# Patient Record
Sex: Female | Born: 1990 | Race: Black or African American | Hispanic: No | Marital: Single | Smoking: Former smoker
Health system: Southern US, Community
[De-identification: ages and names within clinical notes are randomized; demographics above are authoritative.]

## PROBLEM LIST (undated history)

## (undated) DIAGNOSIS — S61209A Unspecified open wound of unspecified finger without damage to nail, initial encounter: Secondary | ICD-10-CM | POA: Insufficient documentation

## (undated) DIAGNOSIS — Z5189 Encounter for other specified aftercare: Secondary | ICD-10-CM

## (undated) DIAGNOSIS — O09899 Supervision of other high risk pregnancies, unspecified trimester: Secondary | ICD-10-CM

## (undated) DIAGNOSIS — Z87898 Personal history of other specified conditions: Secondary | ICD-10-CM

## (undated) DIAGNOSIS — R569 Unspecified convulsions: Secondary | ICD-10-CM

## (undated) DIAGNOSIS — D649 Anemia, unspecified: Secondary | ICD-10-CM

## (undated) DIAGNOSIS — O88219 Thromboembolism in pregnancy, unspecified trimester: Secondary | ICD-10-CM

## (undated) DIAGNOSIS — Z8759 Personal history of other complications of pregnancy, childbirth and the puerperium: Secondary | ICD-10-CM

## (undated) HISTORY — DX: Personal history of other specified conditions: Z87.898

## (undated) HISTORY — DX: Thromboembolism in pregnancy, unspecified trimester: O88.219

## (undated) HISTORY — PX: WISDOM TOOTH EXTRACTION: SHX21

## (undated) HISTORY — DX: Unspecified convulsions: R56.9

## (undated) HISTORY — DX: Supervision of other high risk pregnancies, unspecified trimester: O09.899

## (undated) HISTORY — DX: Encounter for other specified aftercare: Z51.89

---

## 2015-01-08 DIAGNOSIS — B009 Herpesviral infection, unspecified: Secondary | ICD-10-CM | POA: Insufficient documentation

## 2015-01-08 DIAGNOSIS — A749 Chlamydial infection, unspecified: Secondary | ICD-10-CM | POA: Insufficient documentation

## 2016-04-17 ENCOUNTER — Emergency Department
Admission: EM | Admit: 2016-04-17 | Discharge: 2016-04-17 | Disposition: A | Discharge status: Home / Self Care | Payer: Medicaid Other | Attending: Emergency Medicine | Admitting: Emergency Medicine

## 2016-04-17 ENCOUNTER — Encounter: Payer: Self-pay | Admitting: Emergency Medicine

## 2016-04-17 DIAGNOSIS — R109 Unspecified abdominal pain: Secondary | ICD-10-CM | POA: Diagnosis present

## 2016-04-17 DIAGNOSIS — K529 Noninfective gastroenteritis and colitis, unspecified: Secondary | ICD-10-CM | POA: Insufficient documentation

## 2016-04-17 LAB — COMPREHENSIVE METABOLIC PANEL
ALBUMIN: 4.2 g/dL (ref 3.5–5.0)
ALK PHOS: 93 U/L (ref 38–126)
ALT: 21 U/L (ref 14–54)
ANION GAP: 6 (ref 5–15)
AST: 17 U/L (ref 15–41)
BUN: 14 mg/dL (ref 6–20)
CALCIUM: 8.9 mg/dL (ref 8.9–10.3)
CHLORIDE: 111 mmol/L (ref 101–111)
CO2: 23 mmol/L (ref 22–32)
Creatinine, Ser: 0.84 mg/dL (ref 0.44–1.00)
GFR calc non Af Amer: >60 mL/min (ref >60)
GLUCOSE: 90 mg/dL (ref 65–99)
POTASSIUM: 3.7 mmol/L (ref 3.5–5.1)
SODIUM: 140 mmol/L (ref 135–145)
Total Bilirubin: 0.4 mg/dL (ref 0.3–1.2)
Total Protein: 7.8 g/dL (ref 6.5–8.1)

## 2016-04-17 LAB — URINALYSIS, COMPLETE (UACMP) WITH MICROSCOPIC
Bilirubin Urine: NEGATIVE
Glucose, UA: NEGATIVE mg/dL
Hgb urine dipstick: NEGATIVE
KETONES UR: NEGATIVE mg/dL
Nitrite: NEGATIVE
PROTEIN: 30 mg/dL — AB
Specific Gravity, Urine: 1.029 (ref 1.005–1.030)
pH: 5 (ref 5.0–8.0)

## 2016-04-17 LAB — POCT PREGNANCY, URINE: PREG TEST UR: NEGATIVE

## 2016-04-17 LAB — LIPASE, BLOOD: LIPASE: 13 U/L (ref 11–51)

## 2016-04-17 LAB — CBC
HEMATOCRIT: 40.3 % (ref 35.0–47.0)
HEMOGLOBIN: 13.2 g/dL (ref 12.0–16.0)
MCH: 25.3 pg — AB (ref 26.0–34.0)
MCHC: 32.7 g/dL (ref 32.0–36.0)
MCV: 77.2 fL — AB (ref 80.0–100.0)
Platelets: 233 10*3/uL (ref 150–440)
RBC: 5.21 MIL/uL — AB (ref 3.80–5.20)
RDW: 15.7 % — ABNORMAL HIGH (ref 11.5–14.5)
WBC: 5.4 10*3/uL (ref 3.6–11.0)

## 2016-04-17 MED ORDER — FAMOTIDINE 20 MG PO TABS: 20.0000 mg | ORAL_TABLET | Freq: Two times a day (BID) | ORAL | 0 refills | Status: DC

## 2016-04-17 MED ORDER — ONDANSETRON HCL 4 MG PO TABS
ORAL_TABLET | ORAL | Status: AC
  Filled 2016-04-17: qty 1

## 2016-04-17 MED ORDER — ONDANSETRON HCL 4 MG PO TABS: 4.0000 mg | ORAL_TABLET | Freq: Every day | ORAL | 0 refills | Status: DC | PRN

## 2016-04-17 MED ORDER — ONDANSETRON HCL 4 MG PO TABS
4.0000 mg | ORAL_TABLET | Freq: Once | ORAL | Status: AC
  Administered 2016-04-17: 4 mg via ORAL
  Filled 2016-04-17: qty 1

## 2016-04-17 MED ORDER — FAMOTIDINE 20 MG PO TABS
20.0000 mg | ORAL_TABLET | Freq: Once | ORAL | Status: AC
  Administered 2016-04-17: 20 mg via ORAL
  Filled 2016-04-17: qty 1

## 2016-04-17 NOTE — ED Triage Notes (Signed)
Pt to ed with c/o abd pain, diarrhea, vomiting since this am.

## 2016-04-17 NOTE — ED Provider Notes (Signed)
[facility] Emergency Department Provider Note  ____________________________________________  Time seen: Approximately 2:21 PM  I have reviewed the triage vital signs and the nursing notes.   HISTORY  Chief Complaint Abdominal Pain    HPI Gerre Pebblesisha Key is a 25 y.o. female of the emergency department with 1 day of abdominal pain, nausea, vomiting and diarrhea. Patient has eaten crackers today and has not been able to keep them down. Patient has had some chills but no fever. Patient has not taken anything for symptoms. Patient states that family members in the house had similar symptoms yesterday. No history of GERD. Patient denies recent illness, shortness of breath, chest pain or urinary symptoms.   History reviewed. No pertinent past medical history.  There are no active problems to display for this patient.   History reviewed. No pertinent surgical history.  Prior to Admission medications   Medication Sig Start Date End Date Taking? Authorizing Provider  famotidine (PEPCID) 20 MG tablet Take 1 tablet (20 mg total) by mouth 2 (two) times daily. 04/17/16 04/17/17  Enid DerryAshley Damyiah Moxley, PA-C  ondansetron (ZOFRAN) 4 MG tablet Take 1 tablet (4 mg total) by mouth daily as needed for nausea or vomiting. 04/17/16 04/17/17  Enid DerryAshley Jazmeen Axtell, PA-C    Allergies Patient has no known allergies.  History reviewed. No pertinent family history.  Social History Social History  Substance Use Topics  . Smoking status: Never Smoker  . Smokeless tobacco: Never Used  . Alcohol use No     Review of Systems  Constitutional: No fever ENT: No upper respiratory complaints. Cardiovascular: No chest pain. Respiratory: No cough. No SOB. Gastrointestinal: Positive for abdominal pain, nausea, vomiting.  Genitourinary: Negative for dysuria. Musculoskeletal: Negative for musculoskeletal pain. Neurological: Negative for  headaches.   ____________________________________________   PHYSICAL EXAM:  VITAL SIGNS: ED Triage Vitals [04/17/16 1309]  Enc Vitals Group     BP      Pulse Rate 94     Resp 20     Temp 97.5 F (36.4 C)     Temp Source Oral     SpO2 97 %     Weight 288 lb (130.6 kg)     Height 5\' 8"  (1.727 m)     Head Circumference      Peak Flow      Pain Score 8     Pain Loc      Pain Edu?      Excl. in GC?      Constitutional: Alert and oriented. Well appearing and in no acute distress. Eyes: Conjunctivae are normal. PERRL. EOMI. Head: Atraumatic. ENT:      Ears:      Nose: No congestion/rhinnorhea.      Mouth/Throat: Mucous membranes are moist.  Neck: No stridor.  Cardiovascular: Normal rate, regular rhythm. Normal S1 and S2.  Good peripheral circulation. Respiratory: Normal respiratory effort without tachypnea or retractions. Lungs CTAB. Good air entry to the bases with no decreased or absent breath sounds. Gastrointestinal: Bowel sounds 4 quadrants. Epigastric tenderness to palpation. No guarding or rigidity. No palpable masses. No distention. No CVA tenderness. Musculoskeletal: Full range of motion to all extremities. No gross deformities appreciated. Neurologic:  Normal speech and language. No gross focal neurologic deficits are appreciated.  Skin:  Skin is warm, dry and intact. No rash noted. Psychiatric: Mood and affect are normal. Speech and behavior are normal. Patient exhibits appropriate insight and judgement.   ____________________________________________   LABS (all labs ordered are listed, but  only abnormal results are displayed)  Labs Reviewed  CBC - Abnormal; Notable for the following:       Result Value   RBC 5.21 (*)    MCV 77.2 (*)    MCH 25.3 (*)    RDW 15.7 (*)    All other components within normal limits  URINALYSIS, COMPLETE (UACMP) WITH MICROSCOPIC - Abnormal; Notable for the following:    Color, Urine YELLOW (*)    APPearance CLOUDY (*)     Protein, ur 30 (*)    Leukocytes, UA TRACE (*)    Bacteria, UA RARE (*)    Squamous Epithelial / LPF TOO NUMEROUS TO COUNT (*)    All other components within normal limits  LIPASE, BLOOD  COMPREHENSIVE METABOLIC PANEL  POC URINE PREG, ED  POCT PREGNANCY, URINE   ____________________________________________  EKG   ____________________________________________  RADIOLOGY  No results found.  ____________________________________________    PROCEDURES  Procedure(s) performed:    Procedures    Medications  ondansetron (ZOFRAN) tablet 4 mg (4 mg Oral Given 04/17/16 1427)  famotidine (PEPCID) tablet 20 mg (20 mg Oral Given 04/17/16 1444)     ____________________________________________   INITIAL IMPRESSION / ASSESSMENT AND PLAN / ED COURSE  Pertinent labs & imaging results that were available during my care of the patient were reviewed by me and considered in my medical decision making (see chart for details).  Review of the [place] CSRS was performed in accordance of the [facility] prior to dispensing any controlled drugs.  Clinical Course     ----------------------------------------- 2:49 PM on 04/17/2016 -----------------------------------------  Patient states that she is feeling better. Zofran has helped. Patient is drinking water and eating crackers.  ----------------------------------------- 3:18 PM on 04/17/2016 -----------------------------------------  Patient states the pain and nausea are better since Zofran and Pepcid. Patient was able to drink water and eat crackers without vomiting. Patient thinks that she can continue to drink fluids without vomiting and does not need IV.    Patient's diagnosis is consistent with gastroenteritis. Family members in the house have similar symptoms. Zofran and Pepcid improved symptoms. Patient was able to keep water and crackers down without vomiting in emergency room. Exam and vital signs are reassuring. Patient's symptoms  improved before leaving Ed, and patient is comfortable and ready to go home. Patient will be discharged home with prescriptions for zofran and pepcid.  Patient is to follow up with PCP as directed. Patient is given ED precautions to return to the ED for any worsening or new symptoms. All questions were answered.  ____________________________________________  FINAL CLINICAL IMPRESSION(S) / ED DIAGNOSES  Final diagnoses:  Gastroenteritis      NEW MEDICATIONS STARTED DURING THIS VISIT:  New Prescriptions   FAMOTIDINE (PEPCID) 20 MG TABLET    Take 1 tablet (20 mg total) by mouth 2 (two) times daily.   ONDANSETRON (ZOFRAN) 4 MG TABLET    Take 1 tablet (4 mg total) by mouth daily as needed for nausea or vomiting.        This chart was dictated using voice recognition software/Dragon. Despite best efforts to proofread, errors can occur which can change the meaning. Any change was purely unintentional.    Enid DerryAshley Tiwanda Threats, PA-C 04/17/16 1604    Emily FilbertJonathan E Williams, MD 04/17/16 [phone]

## 2016-07-28 ENCOUNTER — Emergency Department: Payer: Medicaid Other

## 2016-07-28 ENCOUNTER — Emergency Department
Admission: EM | Admit: 2016-07-28 | Discharge: 2016-07-28 | Disposition: A | Discharge status: Home / Self Care | Payer: Medicaid Other | Attending: Emergency Medicine | Admitting: Emergency Medicine

## 2016-07-28 ENCOUNTER — Encounter: Payer: Self-pay | Admitting: Emergency Medicine

## 2016-07-28 DIAGNOSIS — X501XXA Overexertion from prolonged static or awkward postures, initial encounter: Secondary | ICD-10-CM | POA: Insufficient documentation

## 2016-07-28 DIAGNOSIS — S3992XA Unspecified injury of lower back, initial encounter: Secondary | ICD-10-CM | POA: Diagnosis present

## 2016-07-28 DIAGNOSIS — Y929 Unspecified place or not applicable: Secondary | ICD-10-CM | POA: Insufficient documentation

## 2016-07-28 DIAGNOSIS — Z79899 Other long term (current) drug therapy: Secondary | ICD-10-CM | POA: Insufficient documentation

## 2016-07-28 DIAGNOSIS — Y9389 Activity, other specified: Secondary | ICD-10-CM | POA: Diagnosis not present

## 2016-07-28 DIAGNOSIS — Y999 Unspecified external cause status: Secondary | ICD-10-CM | POA: Diagnosis not present

## 2016-07-28 DIAGNOSIS — S39012A Strain of muscle, fascia and tendon of lower back, initial encounter: Secondary | ICD-10-CM | POA: Insufficient documentation

## 2016-07-28 LAB — POCT PREGNANCY, URINE: PREG TEST UR: NEGATIVE

## 2016-07-28 MED ORDER — CYCLOBENZAPRINE HCL 5 MG PO TABS: 5.0000 mg | ORAL_TABLET | Freq: Three times a day (TID) | ORAL | 0 refills | Status: AC | PRN

## 2016-07-28 MED ORDER — IBUPROFEN 800 MG PO TABS: 800.0000 mg | ORAL_TABLET | Freq: Three times a day (TID) | ORAL | 0 refills | Status: DC | PRN

## 2016-07-28 NOTE — ED Provider Notes (Signed)
[facility] Emergency Department Provider Note  ____________________________________________  Time seen: Approximately 6:18 PM  I have reviewed the triage vital signs and the nursing notes.   HISTORY  Chief Complaint Back Pain    HPI Sophia Olson is a 26 y.o. female that presents to the emergency department with low back pain after picking up a childs 4 wheeler. Pain is sharp and does not radiate. She states that the 4 wheeler is quite large and heavy. She states that while she picked it up she heard a crack in her back. She is concerned that she fractured something pulled a muscle. She denies any additional injuries. No fever, shortness of breath, chest pain, nausea, vomiting, abdominal pain, bowel or bladder dysfunction, felt paresthesias, dysuria, urgency, frequency, numbness, tingling.   History reviewed. No pertinent past medical history.  There are no active problems to display for this patient.   History reviewed. No pertinent surgical history.  Prior to Admission medications   Medication Sig Start Date End Date Taking? Authorizing Provider  cyclobenzaprine (FLEXERIL) 5 MG tablet Take 1 tablet (5 mg total) by mouth 3 (three) times daily as needed for muscle spasms. 07/28/16 08/04/16  Enid Derry, PA-C  famotidine (PEPCID) 20 MG tablet Take 1 tablet (20 mg total) by mouth 2 (two) times daily. 04/17/16 04/17/17  Enid Derry, PA-C  ibuprofen (ADVIL,MOTRIN) 800 MG tablet Take 1 tablet (800 mg total) by mouth every 8 (eight) hours as needed. 07/28/16   Enid Derry, PA-C  ondansetron (ZOFRAN) 4 MG tablet Take 1 tablet (4 mg total) by mouth daily as needed for nausea or vomiting. 04/17/16 04/17/17  Enid Derry, PA-C    Allergies Patient has no known allergies.  History reviewed. No pertinent family history.  Social History Social History  Substance Use Topics  . Smoking status: Never Smoker  . Smokeless tobacco: Never Used  . Alcohol use No      Review of Systems  Constitutional: No fever/chills ENT: No upper respiratory complaints. Cardiovascular: No chest pain. Respiratory: No cough. No SOB. Gastrointestinal: No abdominal pain.  No nausea, no vomiting.  Genitourinary: Negative for dysuria. Musculoskeletal: Positive for low back pain. Skin: Negative for rash, abrasions, lacerations, ecchymosis. Neurological: Negative for headaches, numbness or tingling   ____________________________________________   PHYSICAL EXAM:  VITAL SIGNS: ED Triage Vitals [07/28/16 1730]  Enc Vitals Group     BP (!) 145/81     Pulse Rate 99     Resp 20     Temp 98.9 F (37.2 C)     Temp Source Oral     SpO2 100 %     Weight 280 lb (127 kg)     Height  (1.727 m)     Head Circumference      Peak Flow      Pain Score 8     Pain Loc      Pain Edu?      Excl. in GC?      Constitutional: Alert and oriented. Well appearing and in no acute distress. Eyes: Conjunctivae are normal. PERRL. EOMI. Head: Atraumatic. ENT:      Ears:      Nose: No congestion/rhinnorhea.      Mouth/Throat: Mucous membranes are moist.  Neck: No stridor.   Cardiovascular: Normal rate, regular rhythm.  Good peripheral circulation. Respiratory: Normal respiratory effort without tachypnea or retractions. Lungs CTAB. Good air entry to the bases with no decreased or absent breath sounds. Gastrointestinal: Bowel sounds 4 quadrants.  Soft and nontender to palpation. No guarding or rigidity. No palpable masses. No distention. No CVA tenderness. Musculoskeletal: Full range of motion to all extremities. No gross deformities appreciated. No tenderness to palpation over thoracic spine. Tenderness to palpation throughout lumbar spine. Tenderness to palpation diffusely over lumbar muscles, primarily on the right. Neurologic:  Normal speech and language. No gross focal neurologic deficits are appreciated.  Skin:  Skin is warm, dry and intact. No rash  noted.    ____________________________________________   LABS (all labs ordered are listed, but only abnormal results are displayed)  Labs Reviewed  POCT PREGNANCY, URINE   ____________________________________________  EKG   ____________________________________________  RADIOLOGY I, Enid Derry, personally viewed and evaluated these images (plain radiographs) as part of my medical decision making, as well as reviewing the written report by the radiologist.  Dg Lumbar Spine 2-3 Views  Result Date: 07/28/2016 CLINICAL DATA:  Low back pain EXAM: LUMBAR SPINE - 2-3 VIEW COMPARISON:  None. FINDINGS: There is no evidence of lumbar spine fracture. Normal lumbar segmentation. Slight disc space narrowing L4-5 and L5-S1. Patient is slightly tilted to the right on the AP view. Otherwise, alignment is unremarkable. IMPRESSION: Slight disc space narrowing L4-5 and L5-S1. No acute osseous appearing abnormality. Electronically Signed   By: Tollie Eth M.D.   On: 07/28/2016 18:48    ____________________________________________    PROCEDURES  Procedure(s) performed:    Procedures    Medications - No data to display   ____________________________________________   INITIAL IMPRESSION / ASSESSMENT AND PLAN / ED COURSE  Pertinent labs & imaging results that were available during my care of the patient were reviewed by me and considered in my medical decision making (see chart for details).  Review of the [place] CSRS was performed in accordance of the N[facility] prior to dispensing any controlled drugs.     Patient's diagnosis is consistent with lumbar strain. Vital signs and exam are reassuring. No acute bony abnormalities per radiology. Patient will be discharged home with prescriptions for ibuprofen and Flexeril. Patient is to follow up with PCP as directed. Patient is given ED precautions to return to the ED for any worsening or new  symptoms.     ____________________________________________  FINAL CLINICAL IMPRESSION(S) / ED DIAGNOSES  Final diagnoses:  Strain of lumbar region, initial encounter      NEW MEDICATIONS STARTED DURING THIS VISIT:  Discharge Medication List as of 07/28/2016  7:39 PM    START taking these medications   Details  cyclobenzaprine (FLEXERIL) 5 MG tablet Take 1 tablet (5 mg total) by mouth 3 (three) times daily as needed for muscle spasms., Starting Sun 07/28/2016, Until Sun 08/04/2016, Print    ibuprofen (ADVIL,MOTRIN) 800 MG tablet Take 1 tablet (800 mg total) by mouth every 8 (eight) hours as needed., Starting Sun 07/28/2016, Print            This chart was dictated using voice recognition software/Dragon. Despite best efforts to proofread, errors can occur which can change the meaning. Any change was purely unintentional.    Enid Derry, PA-C 07/28/16 2013    Merrily Brittle, MD 07/31/16 1058

## 2016-07-28 NOTE — ED Triage Notes (Signed)
Pt presents to ED c/o lower back pain r/t picking up daughter . States " I heard something crack"

## 2017-04-14 ENCOUNTER — Other Ambulatory Visit: Payer: Self-pay

## 2017-04-14 ENCOUNTER — Encounter: Payer: Self-pay | Admitting: Emergency Medicine

## 2017-04-14 ENCOUNTER — Emergency Department
Admission: EM | Admit: 2017-04-14 | Discharge: 2017-04-14 | Disposition: A | Discharge status: Home / Self Care | Payer: Self-pay | Attending: Emergency Medicine | Admitting: Emergency Medicine

## 2017-04-14 DIAGNOSIS — J029 Acute pharyngitis, unspecified: Secondary | ICD-10-CM | POA: Insufficient documentation

## 2017-04-14 LAB — GROUP A STREP BY PCR: Group A Strep by PCR: NOT DETECTED

## 2017-04-14 NOTE — ED Provider Notes (Signed)
[facility] Emergency Department Provider Note  ____________________________________________   First MD Initiated Contact with Patient 04/14/17 [phone]     (approximate)  I have reviewed the triage vital signs and the nursing notes.   HISTORY  Chief Complaint Sore Throat   HPI Sophia Olson is a 26 y.o. female is here complaining of sore throat since yesterday.patient's is a unsure of fever as she did not have a thermometer. She denies any other symptoms.she rates her pain is 7 out of 10.  History reviewed. No pertinent past medical history.  There are no active problems to display for this patient.   History reviewed. No pertinent surgical history.  Prior to Admission medications   Not on File    Allergies Patient has no known allergies.  History reviewed. No pertinent family history.  Social History Social History   Tobacco Use  . Smoking status: Never Smoker  . Smokeless tobacco: Never Used  Substance Use Topics  . Alcohol use: No  . Drug use: No    Review of Systems Constitutional: No fever/chills Eyes: No visual changes. ENT: positive sore throat. Cardiovascular: Denies chest pain. Respiratory: Denies shortness of breath. Gastrointestinal: No abdominal pain.  No nausea, no vomiting. Skin: Negative for rash. Neurological: Negative for headaches, focal weakness or numbness. ____________________________________________   PHYSICAL EXAM:  VITAL SIGNS: ED Triage Vitals  Enc Vitals Group     BP 04/14/17 0737 113/65     Pulse Rate 04/14/17 0737 (!) 102     Resp 04/14/17 0737 18     Temp 04/14/17 0737 98.5 F (36.9 C)     Temp Source 04/14/17 0737 Oral     SpO2 04/14/17 0737 96 %     Weight 04/14/17 0735 280 lb (127 kg)     Height --      Head Circumference --      Peak Flow --      Pain Score 04/14/17 0734 7     Pain Loc --      Pain Edu? --      Excl. in GC? --    Constitutional: Alert and oriented. Well appearing  and in no acute distress. Eyes: Conjunctivae are normal.  Head: Atraumatic. Nose: No congestion/rhinnorhea.  EACs and TMs are clear bilaterally. Mouth/Throat: Mucous membranes are moist.  Oropharynx non-erythematous. No exudate or tonsillar enlargement. Uvula is midline. Neck: No stridor.   Hematological/Lymphatic/Immunilogical: No cervical lymphadenopathy. Cardiovascular: Normal rate, regular rhythm. Grossly normal heart sounds.  Good peripheral circulation. Respiratory: Normal respiratory effort.  No retractions. Lungs CTAB. Musculoskeletal: moves upper a. Normal gait was noted. Neurologic:  Normal speech and language. No gross focal neurologic deficits are appreciated. No gait instability. Skin:  Skin is warm, dry and intact. No rash noted. Psychiatric: Mood and affect are normal. Speech and behavior are normal.  ____________________________________________   LABS (all labs ordered are listed, but only abnormal results are displayed)  Labs Reviewed  GROUP A STREP BY PCR     PROCEDURES  Procedure(s) performed: None  Procedures  Critical Care performed: No  ____________________________________________   INITIAL IMPRESSION / ASSESSMENT AND PLAN / ED COURSE Strep test was negative and patient was reassured that most likely this is viral. She is to take over-the-counter decongestant, Tylenol or ibuprofen for throat pain and increase fluids. [facility] if any continued problems.  ____________________________________________   FINAL CLINICAL IMPRESSION(S) / ED DIAGNOSES  Final diagnoses:  Pharyngitis, unspecified etiology  ED Discharge Orders    None       Note:  This document was prepared using Dragon voice recognition software and may include unintentional dictation errors.    Tommi RumpsSummers, Rhonda L, PA-C 04/14/17 1022    Schaevitz, Myra Rudeavid Matthew, MD 04/14/17 [phone]

## 2017-04-14 NOTE — ED Triage Notes (Signed)
Pt c/o sore throat since yesterday and thinks has strep. Unlabored respirations. Secretions controlled. Ambulatory.

## 2017-04-14 NOTE — Discharge Instructions (Signed)
Use Tylenol or ibuprofen as needed for throat pain. Sudafed if any continued nasal congestion. Increase fluids.follow-[facility] if any continued problems.

## 2017-04-14 NOTE — ED Notes (Signed)
See triage note  Developed sore throat yesterday  Increased pain with swallowing  No fever

## 2017-04-23 ENCOUNTER — Encounter: Payer: Self-pay | Admitting: Emergency Medicine

## 2017-04-23 ENCOUNTER — Other Ambulatory Visit: Payer: Self-pay

## 2017-04-23 ENCOUNTER — Emergency Department
Admission: EM | Admit: 2017-04-23 | Discharge: 2017-04-23 | Disposition: A | Discharge status: Home / Self Care | Payer: Self-pay | Attending: Emergency Medicine | Admitting: Emergency Medicine

## 2017-04-23 DIAGNOSIS — J029 Acute pharyngitis, unspecified: Secondary | ICD-10-CM | POA: Insufficient documentation

## 2017-04-23 LAB — GROUP A STREP BY PCR: GROUP A STREP BY PCR: NOT DETECTED

## 2017-04-23 MED ORDER — DEXAMETHASONE SODIUM PHOSPHATE 10 MG/ML IJ SOLN
10.0000 mg | Freq: Once | INTRAMUSCULAR | Status: AC
  Administered 2017-04-23: 10 mg via INTRAMUSCULAR

## 2017-04-23 MED ORDER — ELVITEG-COBIC-EMTRICIT-TENOFAF 150-150-200-10 MG PO TABS: 1.0000 | ORAL_TABLET | Freq: Every day | ORAL | 0 refills | Status: DC

## 2017-04-23 MED ORDER — DEXAMETHASONE SODIUM PHOSPHATE 10 MG/ML IJ SOLN
INTRAMUSCULAR | Status: AC
  Filled 2017-04-23: qty 1

## 2017-04-23 MED ORDER — MAGIC MOUTHWASH W/LIDOCAINE: 5.0000 mL | Freq: Four times a day (QID) | ORAL | 0 refills | Status: DC

## 2017-04-23 NOTE — ED Provider Notes (Signed)
[facility] Emergency Department Provider Note   ____________________________________________   First MD Initiated Contact with Patient 04/23/17 [phone]     (approximate)  I have reviewed the triage vital signs and the nursing notes.   HISTORY  Chief Complaint Sore Throat    HPI Sophia Olson is a 27 y.o. female patient complaining of sore throat which has gotten worse in the past 3 days. Patient states she was seen here on 1224 for same complaint but cannot follow up because she did have PCP. Patient denies fever this complaint patient is a mild cough and postnasal drainage. Patient states she's taking Mucinex and Tylenol for relief. Patient rates the pain as a 6/10. Patient describes pain as "sore".   History reviewed. No pertinent past medical history.  There are no active problems to display for this patient.   History reviewed. No pertinent surgical history.    Allergies Patient has no known allergies.  History reviewed. No pertinent family history.  Social History Social History   Tobacco Use  . Smoking status: Never Smoker  . Smokeless tobacco: Never Used  Substance Use Topics  . Alcohol use: No  . Drug use: No    Review of Systems  Constitutional: No fever/chills Eyes: No visual changes. ENT: Sore throat  Cardiovascular: Denies chest pain. Respiratory: Denies shortness of breath. Gastrointestinal: No abdominal pain.  No nausea, no vomiting.  No diarrhea.  No constipation. Genitourinary: Negative for dysuria. Musculoskeletal: Negative for back pain. Skin: Negative for rash. Neurological: Negative for headaches, focal weakness or numbness.   ____________________________________________   PHYSICAL EXAM:  VITAL SIGNS: ED Triage Vitals  Enc Vitals Group     BP 04/23/17 0815 111/62     Pulse Rate 04/23/17 0815 76     Resp 04/23/17 0815 18     Temp 04/23/17 0815 98.8 F (37.1 C)     Temp Source 04/23/17 0815 Oral     SpO2 04/23/17 0815 100 %     Weight 04/23/17 0813 280 lb (127 kg)     Height 04/23/17 0813 5\' 8"  (1.727 m)     Head Circumference --      Peak Flow --      Pain Score 04/23/17 0813 6     Pain Loc --      Pain Edu? --      Excl. in GC? --    Constitutional: Alert and oriented. Well appearing and in no acute distress. Eyes: Conjunctivae are normal. PERRL. EOMI. Head: Atraumatic. Nose: No congestion/rhinnorhea. Mouth/Throat: Mucous membranes are moist.  Oropharynx erythematous. Neck: No stridor.   Cardiovascular: Normal rate, regular rhythm. Grossly normal heart sounds.  Good peripheral circulation. Respiratory: Normal respiratory effort.  No retractions. Lungs CTAB. Gastrointestinal: Soft and nontender. No distention. No abdominal bruits. No CVA tenderness. Musculoskeletal: No lower extremity tenderness nor edema.  No joint effusions. Neurologic:  Normal speech and language. No gross focal neurologic deficits are appreciated. No gait instability. Skin:  Skin is warm, dry and intact. No rash noted. Psychiatric: Mood and affect are normal. Speech and behavior are normal.  ____________________________________________   LABS (all labs ordered are listed, but only abnormal results are displayed)  Labs Reviewed  GROUP A STREP BY PCR   ____________________________________________  EKG ____________________________________________  RADIOLOGY  No results found.  ____________________________________________   PROCEDURES  Procedure(s) performed: None  Procedures  Critical Care performed: No  ____________________________________________   INITIAL IMPRESSION / ASSESSMENT AND PLAN / ED COURSE  As part  of my medical decision making, I reviewed the following data within the electronic MEDICAL RECORD NUMBER    Sore throat secondary to viral pharyngitis. Patient given discharge instructions advised take medication as directed. Patient [facility].      ____________________________________________   FINAL CLINICAL IMPRESSION(S) / ED DIAGNOSES  Final diagnoses:  Viral pharyngitis        Note:  This document was prepared using Dragon voice recognition software and may include unintentional dictation errors.    Joni Reining, PA-C 04/23/17 1622    Joni Reining, PA-C 04/23/17 1636    Sharman Cheek, MD 04/24/17 [phone]

## 2017-04-23 NOTE — ED Triage Notes (Signed)
Pt c/o sore throat, states worse over the past 3 days, pt states she has pain when she swallows. Pt states she was here on 12/24 for the same states she does not have a PCP to follow up with.  Denies any fevers, reports slight cough and post nasal drip pt states she has been taking mucinex and tylenol.

## 2017-04-23 NOTE — ED Provider Notes (Signed)
[facility] Emergency Department Provider Note   ____________________________________________   First MD Initiated Contact with Patient 04/23/17 [id number]     (approximate)  I have reviewed the triage vital signs and the nursing notes.   HISTORY  Chief Complaint Sore Throat    HPI Sophia Olson is a 27 y.o. female patient continued to have sore throat which is worse since the last visit on 04/14/2017. She denies fever.. Patient has a slight cough and she has a postnasal drainage. Patient states she's taking Tylenol recently.Patient rates the pain 6/10. Patient describes the pain as "sore". Patient states pain increase with swallowing although she can tolerate fluids and solids.   History reviewed. No pertinent past medical history.  There are no active problems to display for this patient.   History reviewed. No pertinent surgical history.    Allergies Patient has no known allergies.  History reviewed. No pertinent family history.  Social History Social History   Tobacco Use  . Smoking status: Never Smoker  . Smokeless tobacco: Never Used  Substance Use Topics  . Alcohol use: No  . Drug use: No    Review of Systems Constitutional: No fever/chills Eyes: No visual changes. ENT: Sore throat. Cardiovascular: Denies chest pain. Respiratory: Denies shortness of breath. Gastrointestinal: No abdominal pain.  No nausea, no vomiting.  No diarrhea.  No constipation. Genitourinary: Negative for dysuria. Musculoskeletal: Negative for back pain. Skin: Negative for rash. Neurological: Negative for headaches, focal weakness or numbness.   ____________________________________________   PHYSICAL EXAM:  VITAL SIGNS: ED Triage Vitals  Enc Vitals Group     BP 04/23/17 0815 111/62     Pulse Rate 04/23/17 0815 76     Resp 04/23/17 0815 18     Temp 04/23/17 0815 98.8 F (37.1 C)     Temp Source 04/23/17 0815 Oral     SpO2 04/23/17 0815 100 %       Weight 04/23/17 0813 280 lb (127 kg)     Height 04/23/17 0813 5\' 8"  (1.727 m)     Head Circumference --      Peak Flow --      Pain Score 04/23/17 0813 6     Pain Loc --      Pain Edu? --      Excl. in GC? --     Constitutional: Alert and oriented. Well appearing and in no acute distress. Eyes: Conjunctivae are normal. PERRL. EOMI. Head: Atraumatic. Nose: No congestion/rhinnorhea. Mouth/Throat: Mucous membranes are moist.  Oropharynx erythematous with edematous and not exudative tonsils. Neck: No stridor.  Cardiovascular: Normal rate, regular rhythm. Grossly normal heart sounds.  Good peripheral circulation. Respiratory: Normal respiratory effort.  No retractions. Lungs CTAB. Neurologic:  Normal speech and language. No gross focal neurologic deficits are appreciated.  Skin:  Skin is warm, dry and intact. No rash noted. Psychiatric: Mood and affect are normal. Speech and behavior are normal.  ____________________________________________   LABS (all labs ordered are listed, but only abnormal results are displayed)  Labs Reviewed  GROUP A STREP BY PCR   ____________________________________________  EKG   ____________________________________________  RADIOLOGY  No results found.  ____________________________________________   PROCEDURES  Procedure(s) performed: None  Procedures  Critical Care performed: No  ____________________________________________   INITIAL IMPRESSION / ASSESSMENT AND PLAN / ED COURSE  As part of my medical decision making, I reviewed the following data within the electronic MEDICAL RECORD NUMBER    Viral pharyngitis. Discussed negative strep test with patient.  Patient given a prescription for Magic mouthwash and [facility].      ____________________________________________   FINAL CLINICAL IMPRESSION(S) / ED DIAGNOSES  Final diagnoses:  Viral pharyngitis        Note:  This document was  prepared using Dragon voice recognition software and may include unintentional dictation errors.    Joni Reining, PA-C 04/23/17 1632    Sharman Cheek, MD 04/24/17 [phone]

## 2017-09-30 ENCOUNTER — Encounter: Payer: Self-pay | Admitting: Emergency Medicine

## 2017-09-30 ENCOUNTER — Emergency Department
Admission: EM | Admit: 2017-09-30 | Discharge: 2017-09-30 | Disposition: A | Discharge status: Home / Self Care | Payer: Self-pay | Attending: Emergency Medicine | Admitting: Emergency Medicine

## 2017-09-30 DIAGNOSIS — N390 Urinary tract infection, site not specified: Secondary | ICD-10-CM | POA: Insufficient documentation

## 2017-09-30 DIAGNOSIS — R300 Dysuria: Secondary | ICD-10-CM | POA: Insufficient documentation

## 2017-09-30 DIAGNOSIS — Z79899 Other long term (current) drug therapy: Secondary | ICD-10-CM | POA: Insufficient documentation

## 2017-09-30 LAB — URINALYSIS, COMPLETE (UACMP) WITH MICROSCOPIC
BILIRUBIN URINE: NEGATIVE
GLUCOSE, UA: NEGATIVE mg/dL
KETONES UR: NEGATIVE mg/dL
NITRITE: NEGATIVE
PH: 6 (ref 5.0–8.0)
PROTEIN: 30 mg/dL — AB
Specific Gravity, Urine: 1.013 (ref 1.005–1.030)

## 2017-09-30 MED ORDER — SULFAMETHOXAZOLE-TRIMETHOPRIM 800-160 MG PO TABS: 1.0000 | ORAL_TABLET | Freq: Two times a day (BID) | ORAL | 0 refills | Status: DC

## 2017-09-30 MED ORDER — FLUCONAZOLE 150 MG PO TABS: ORAL_TABLET | ORAL | 0 refills | Status: DC

## 2017-09-30 MED ORDER — ONDANSETRON 4 MG PO TBDP: 4.0000 mg | ORAL_TABLET | Freq: Three times a day (TID) | ORAL | 0 refills | Status: DC | PRN

## 2017-09-30 NOTE — Discharge Instructions (Addendum)
Follow-up with your regular doctor or the [facility] as needed.  If you are worsening please return the emergency department.  Use medication as prescribed.

## 2017-09-30 NOTE — ED Triage Notes (Signed)
Pt c/o pain to lower back since yesterday. Pt also reports some urinary sx's.

## 2017-09-30 NOTE — ED Provider Notes (Signed)
[facility] Emergency Department Provider Note  ____________________________________________   First MD Initiated Contact with Patient 09/30/17 1056     (approximate)  I have reviewed the triage vital signs and the nursing notes.   HISTORY  Chief Complaint Back Pain and Dysuria    HPI Sophia Olson is a 27 y.o. female presents emergency department complaining of lower back pain with urinary frequency since yesterday.  She states she had some questionable fever and chills.  States her lower back feels like spasms.  She is had some nausea but no vomiting.  She states she is currently on her.  So she is not pregnant.  She denies any vaginal discharge or exposure to STD at this time.  History reviewed. No pertinent past medical history.  There are no active problems to display for this patient.   History reviewed. No pertinent surgical history.  Prior to Admission medications   Medication Sig Start Date End Date Taking? Authorizing Provider  elvitegravir-cobicistat-emtricitabine-tenofovir (GENVOYA) 150-150-200-10 MG TABS tablet Take 1 tablet by mouth daily with breakfast. 04/23/17   Joni ReiningSmith, Ronald K, PA-C  fluconazole (DIFLUCAN) 150 MG tablet Take one now and one in a week 09/30/17   Sherrie MustacheFisher, Roselyn BeringSusan W, PA-C  magic mouthwash w/lidocaine SOLN Take 5 mLs by mouth 4 (four) times daily. 04/23/17   Joni ReiningSmith, Ronald K, PA-C  ondansetron (ZOFRAN-ODT) 4 MG disintegrating tablet Take 1 tablet (4 mg total) by mouth every 8 (eight) hours as needed for nausea or vomiting. 09/30/17   Sherrie MustacheFisher, Roselyn BeringSusan W, PA-C  sulfamethoxazole-trimethoprim (BACTRIM DS,SEPTRA DS) 800-160 MG tablet Take 1 tablet by mouth 2 (two) times daily. 09/30/17   Faythe GheeFisher, Susan W, PA-C    Allergies Patient has no known allergies.  No family history on file.  Social History Social History   Tobacco Use  . Smoking status: Never Smoker  . Smokeless tobacco: Never Used  Substance Use Topics  . Alcohol use: No   . Drug use: No    Review of Systems  Constitutional: No fever/chills Eyes: No visual changes. ENT: No sore throat. Respiratory: Denies cough Gastrointestinal: Positive for nausea but no vomiting Genitourinary: Negative for dysuria.  Positive for urinary frequency Musculoskeletal: Positive for low back pain. Skin: Negative for rash.    ____________________________________________   PHYSICAL EXAM:  VITAL SIGNS: ED Triage Vitals [09/30/17 1035]  Enc Vitals Group     BP      Pulse      Resp      Temp      Temp src      SpO2      Weight (!) 310 lb (140.6 kg)     Height 5\' 8"  (1.727 m)     Head Circumference      Peak Flow      Pain Score 8     Pain Loc      Pain Edu?      Excl. in GC?     Constitutional: Alert and oriented. Well appearing and in no acute distress. Eyes: Conjunctivae are normal.  Head: Atraumatic. Nose: No congestion/rhinnorhea. Mouth/Throat: Mucous membranes are moist.   Cardiovascular: Normal rate, regular rhythm.  Heart sounds are normal Respiratory: Normal respiratory effort.  No retractions, lungs clear to auscultation Abdomen: Is soft nontender, no CVA tenderness is noted GU: deferred Musculoskeletal: FROM all extremities, warm and well perfused Neurologic:  Normal speech and language.  Skin:  Skin is warm, dry and intact. No rash noted. Psychiatric: Mood and affect are  normal. Speech and behavior are normal.  ____________________________________________   LABS (all labs ordered are listed, but only abnormal results are displayed)  Labs Reviewed  URINALYSIS, COMPLETE (UACMP) WITH MICROSCOPIC - Abnormal; Notable for the following components:      Result Value   Color, Urine YELLOW (*)    APPearance HAZY (*)    Hgb urine dipstick SMALL (*)    Protein, ur 30 (*)    Leukocytes, UA LARGE (*)    WBC, UA >50 (*)    Bacteria, UA FEW (*)    All other components within normal limits    ____________________________________________   ____________________________________________  RADIOLOGY    ____________________________________________   PROCEDURES  Procedure(s) performed: No  Procedures    ____________________________________________   INITIAL IMPRESSION / ASSESSMENT AND PLAN / ED COURSE  Pertinent labs & imaging results that were available during my care of the patient were reviewed by me and considered in my medical decision making (see chart for details).  Patient is a 27 year old female presents emergency department complaining of urinary frequency and low back pain.  She is also had fever and chills overnight.  She has some nausea but no vomiting.  On physical exam the abdomen is soft and nontender and there is no CVA tenderness.  The remainder the exam is unremarkable  UA is positive for large amount leukocytes, greater than 50 white blood cells, and a few bacteria  Explained the urinalysis results to the patient.  She was given a prescription for Septra DS 1 twice daily for 7 days, Zofran 4 mg ODT as needed for nausea, and Diflucan 150 mg 1 now 1 in a week to prevent yeast as patient states she gets yeast with all antibiotics.  She states she understands the discharge instructions.  She understands to return to emergency department if worsening.  She was discharged in stable condition.     As part of my medical decision making, I reviewed the following data within the electronic MEDICAL RECORD NUMBER Nursing notes reviewed and incorporated, Labs reviewed UA with large leuks, white blood cells and bacteria, Old chart reviewed, Notes from prior ED visits and [place] Controlled Substance Database  ____________________________________________   FINAL CLINICAL IMPRESSION(S) / ED DIAGNOSES  Final diagnoses:  Lower urinary tract infectious disease      NEW MEDICATIONS STARTED DURING THIS VISIT:  Discharge Medication List as of 09/30/2017 11:10 AM     START taking these medications   Details  fluconazole (DIFLUCAN) 150 MG tablet Take one now and one in a week, Print    ondansetron (ZOFRAN-ODT) 4 MG disintegrating tablet Take 1 tablet (4 mg total) by mouth every 8 (eight) hours as needed for nausea or vomiting., Starting Tue 09/30/2017, Print    sulfamethoxazole-trimethoprim (BACTRIM DS,SEPTRA DS) 800-160 MG tablet Take 1 tablet by mouth 2 (two) times daily., Starting Tue 09/30/2017, Print         Note:  This document was prepared using Dragon voice recognition software and may include unintentional dictation errors.    Faythe Ghee, PA-C 09/30/17 1229    Don Perking, [place], MD 10/01/17 1249

## 2019-04-12 LAB — URINE CULTURE
Cystic Fibrosis Profile: NEGATIVE
Drug Screen, Urine: NEGATIVE
Pap: NEGATIVE
Urine Culture, OB: NO GROWTH

## 2019-04-12 LAB — OB RESULTS CONSOLE HIV ANTIBODY (ROUTINE TESTING): HIV: NONREACTIVE

## 2019-04-12 LAB — HEMOGLOBIN EVAL RFX ELECTROPHORESIS: Hemoglobin Evaluation: NORMAL

## 2019-04-12 LAB — OB RESULTS CONSOLE HGB/HCT, BLOOD
HCT: 33 (ref 29–41)
Hemoglobin: 10.5

## 2019-04-12 LAB — OB RESULTS CONSOLE ANTIBODY SCREEN: Antibody Screen: NEGATIVE

## 2019-04-12 LAB — OB RESULTS CONSOLE GC/CHLAMYDIA
Chlamydia: NEGATIVE
Gonorrhea: NEGATIVE

## 2019-04-12 LAB — OB RESULTS CONSOLE ABO/RH: RH Type: POSITIVE

## 2019-04-12 LAB — OB RESULTS CONSOLE HEPATITIS B SURFACE ANTIGEN: Hepatitis B Surface Ag: NEGATIVE

## 2019-04-12 LAB — OB RESULTS CONSOLE VARICELLA ZOSTER ANTIBODY, IGG: Varicella: IMMUNE

## 2019-04-12 LAB — OB RESULTS CONSOLE RPR: RPR: NONREACTIVE

## 2019-04-12 LAB — OB RESULTS CONSOLE RUBELLA ANTIBODY, IGM: Rubella: IMMUNE

## 2019-04-12 LAB — OB RESULTS CONSOLE PLATELET COUNT: Platelets: 259

## 2019-04-26 ENCOUNTER — Encounter: Payer: Self-pay | Admitting: *Deleted

## 2019-04-26 DIAGNOSIS — Z87898 Personal history of other specified conditions: Secondary | ICD-10-CM | POA: Insufficient documentation

## 2019-04-26 DIAGNOSIS — O09899 Supervision of other high risk pregnancies, unspecified trimester: Secondary | ICD-10-CM | POA: Insufficient documentation

## 2019-04-26 DIAGNOSIS — O0990 Supervision of high risk pregnancy, unspecified, unspecified trimester: Secondary | ICD-10-CM | POA: Insufficient documentation

## 2019-04-26 DIAGNOSIS — Z98891 History of uterine scar from previous surgery: Secondary | ICD-10-CM | POA: Insufficient documentation

## 2019-04-26 DIAGNOSIS — Z8759 Personal history of other complications of pregnancy, childbirth and the puerperium: Secondary | ICD-10-CM | POA: Insufficient documentation

## 2019-04-26 DIAGNOSIS — Z86711 Personal history of pulmonary embolism: Secondary | ICD-10-CM

## 2019-04-29 ENCOUNTER — Ambulatory Visit (INDEPENDENT_AMBULATORY_CARE_PROVIDER_SITE_OTHER): Payer: Medicaid Other | Admitting: Obstetrics and Gynecology

## 2019-04-29 ENCOUNTER — Other Ambulatory Visit: Payer: Self-pay

## 2019-04-29 ENCOUNTER — Encounter: Payer: Self-pay | Admitting: Obstetrics and Gynecology

## 2019-04-29 VITALS — BP 123/70 | HR 97 | Wt 327.3 lb

## 2019-04-29 DIAGNOSIS — Z86711 Personal history of pulmonary embolism: Secondary | ICD-10-CM

## 2019-04-29 DIAGNOSIS — Z8759 Personal history of other complications of pregnancy, childbirth and the puerperium: Secondary | ICD-10-CM

## 2019-04-29 DIAGNOSIS — O0990 Supervision of high risk pregnancy, unspecified, unspecified trimester: Secondary | ICD-10-CM

## 2019-04-29 DIAGNOSIS — Z98891 History of uterine scar from previous surgery: Secondary | ICD-10-CM

## 2019-04-29 DIAGNOSIS — O0992 Supervision of high risk pregnancy, unspecified, second trimester: Secondary | ICD-10-CM

## 2019-04-29 DIAGNOSIS — O09899 Supervision of other high risk pregnancies, unspecified trimester: Secondary | ICD-10-CM

## 2019-04-29 DIAGNOSIS — Z3A15 15 weeks gestation of pregnancy: Secondary | ICD-10-CM

## 2019-04-29 DIAGNOSIS — Z87898 Personal history of other specified conditions: Secondary | ICD-10-CM

## 2019-04-29 DIAGNOSIS — Z3009 Encounter for other general counseling and advice on contraception: Secondary | ICD-10-CM | POA: Insufficient documentation

## 2019-04-29 DIAGNOSIS — O09892 Supervision of other high risk pregnancies, second trimester: Secondary | ICD-10-CM

## 2019-04-29 MED ORDER — BLOOD PRESSURE MONITORING DEVI: 1.0000 | 0 refills | Status: DC

## 2019-04-29 MED ORDER — ENOXAPARIN SODIUM 40 MG/0.4ML ~~LOC~~ SOLN: 40.0000 mg | SUBCUTANEOUS | 1 refills | Status: DC

## 2019-04-29 NOTE — Progress Notes (Signed)
Subjective:  Sophia Olson is a 29 y.o. G4P0 at [redacted]w[redacted]d being seen today for ongoing prenatal care. Transferred from [facility] due to H/O PE with first pregnancy. H/O PTL with PTD with first pregnancy related to PE per pt account. C section with first pregnancy due to malpresentation. All other c sections were repeat LTCS.  She is currently monitored for the following issues for this high-risk pregnancy and has Supervision of high risk pregnancy, antepartum; History of pulmonary embolus during pregnancy; History of preterm delivery, currently pregnant; History of low birth weight; History of 3 cesarean sections; Obesity, morbid, BMI 50 or higher (HCC); and Unwanted fertility on their problem list.  Patient reports no complaints.  Contractions: Not present. Vag. Bleeding: None.  Movement: Present. Denies leaking of fluid.   The following portions of the patient's history were reviewed and updated as appropriate: allergies, current medications, past family history, past medical history, past social history, past surgical history and problem list. Problem list updated.  Objective:   Vitals:   04/29/19 1356  BP: 123/70  Pulse: 97  Weight: (!) 327 lb 4.8 oz (148.5 kg)    Fetal Status: Fetal Heart Rate (bpm): 154   Movement: Present     General:  Alert, oriented and cooperative. Patient is in no acute distress.  Skin: Skin is warm and dry. No rash noted.   Cardiovascular: Normal heart rate noted  Respiratory: Normal respiratory effort, no problems with respiration noted  Abdomen: Soft, gravid, appropriate for gestational age. Pain/Pressure: Absent     Pelvic:  Cervical exam deferred        Extremities: Normal range of motion.  Edema: None  Mental Status: Normal mood and affect. Normal behavior. Normal judgment and thought content.   Urinalysis:      Assessment and Plan:  Pregnancy: G4P0 at [redacted]w[redacted]d  1. Supervision of high risk pregnancy, antepartum Prenatal care reviewed with pt. BS and BP  monitoring reviewed with pt Decline flu vaccine  Genetic testing reviewed with pt - Blood Pressure Monitoring DEVI; 1 Device by Does not apply route once a week.  Dispense: 1 Device; Refill: 0 - Genetic Screening - [place] MFM OB DETAIL +14 WK; Future  2. History of pulmonary embolus during pregnancy Reviewed with pt. Teaching reviewed today - enoxaparin (LOVENOX) 40 MG/0.4ML injection; Inject 0.4 mLs (40 mg total) into the skin daily.  Dispense: 30 mL; Refill: 1 - [place] MFM OB DETAIL +14 WK; Future  3. History of 3 cesarean sections Will need repeat at 39 weeks  4. History of low birth weight D/U to preterm delivery  5. History of preterm delivery, currently pregnant Do not feel pt is candidate for 17 OHP as delivery was related PE  6. Unwanted fertility Will need to sign BTL papers at 28 weeks  Preterm labor symptoms and general obstetric precautions including but not limited to vaginal bleeding, contractions, leaking of fluid and fetal movement were reviewed in detail with the patient. Please refer to After Visit Summary for other counseling recommendations.  Return in about 4 weeks (around 05/27/2019) for OB visit, face to face , MD provider.   Hermina Staggers, MD

## 2019-04-29 NOTE — Progress Notes (Signed)
Medicaid Home Form Completed-04/29/19

## 2019-04-29 NOTE — Progress Notes (Signed)
Medicaid Form Completed-04/29/2019

## 2019-04-29 NOTE — Patient Instructions (Signed)

## 2019-04-30 ENCOUNTER — Encounter: Payer: Self-pay | Admitting: *Deleted

## 2019-05-10 ENCOUNTER — Encounter: Payer: Self-pay | Admitting: *Deleted

## 2019-05-12 ENCOUNTER — Telehealth: Payer: Self-pay | Admitting: Lactation Services

## 2019-05-12 NOTE — Telephone Encounter (Signed)
Called pt with [employer] results. Pt did not answer. LM for pt to call the office at her convenience for results. Will send My Chart message also.

## 2019-05-17 ENCOUNTER — Encounter: Payer: Self-pay | Admitting: *Deleted

## 2019-05-19 ENCOUNTER — Encounter: Payer: Self-pay | Admitting: Obstetrics and Gynecology

## 2019-05-19 DIAGNOSIS — Z148 Genetic carrier of other disease: Secondary | ICD-10-CM | POA: Insufficient documentation

## 2019-05-19 DIAGNOSIS — D563 Thalassemia minor: Secondary | ICD-10-CM | POA: Insufficient documentation

## 2019-05-28 ENCOUNTER — Encounter: Payer: Self-pay | Admitting: Obstetrics and Gynecology

## 2019-05-28 ENCOUNTER — Telehealth (INDEPENDENT_AMBULATORY_CARE_PROVIDER_SITE_OTHER): Payer: Medicaid Other | Admitting: Obstetrics and Gynecology

## 2019-05-28 VITALS — BP 129/74 | HR 91

## 2019-05-28 DIAGNOSIS — Z3009 Encounter for other general counseling and advice on contraception: Secondary | ICD-10-CM

## 2019-05-28 DIAGNOSIS — O0992 Supervision of high risk pregnancy, unspecified, second trimester: Secondary | ICD-10-CM

## 2019-05-28 DIAGNOSIS — Z3A19 19 weeks gestation of pregnancy: Secondary | ICD-10-CM

## 2019-05-28 DIAGNOSIS — Z86711 Personal history of pulmonary embolism: Secondary | ICD-10-CM

## 2019-05-28 DIAGNOSIS — O0990 Supervision of high risk pregnancy, unspecified, unspecified trimester: Secondary | ICD-10-CM

## 2019-05-28 DIAGNOSIS — O09899 Supervision of other high risk pregnancies, unspecified trimester: Secondary | ICD-10-CM

## 2019-05-28 DIAGNOSIS — D563 Thalassemia minor: Secondary | ICD-10-CM

## 2019-05-28 DIAGNOSIS — Z98891 History of uterine scar from previous surgery: Secondary | ICD-10-CM

## 2019-05-28 DIAGNOSIS — Z148 Genetic carrier of other disease: Secondary | ICD-10-CM

## 2019-05-28 DIAGNOSIS — Z8759 Personal history of other complications of pregnancy, childbirth and the puerperium: Secondary | ICD-10-CM

## 2019-05-28 NOTE — Progress Notes (Signed)
   TELEHEALTH OBSTETRICS PRENATAL VIRTUAL VIDEO VISIT ENCOUNTER NOTE  Provider location: Center for [employer] at [place]   I connected with Sophia Olson on 05/28/19 at  8:15 AM EST by MyChart Video Encounter at home and verified that I am speaking with the correct person using two identifiers.   I discussed the limitations, risks, security and privacy concerns of performing an evaluation and management service virtually and the availability of in person appointments. I also discussed with the patient that there may be a patient responsible charge related to this service. The patient expressed understanding and agreed to proceed. Subjective:  Sophia Olson is a 29 y.o. G4P0 at [redacted]w[redacted]d being seen today for ongoing prenatal care.  She is currently monitored for the following issues for this high-risk pregnancy and has Supervision of high risk pregnancy, antepartum; History of pulmonary embolus during pregnancy; History of preterm delivery, currently pregnant; History of low birth weight; History of 3 cesarean sections; Obesity, morbid, BMI 50 or higher (HCC); Unwanted fertility; Alpha thalassemia silent carrier; and Genetic carrier on their problem list.  Patient reports no complaints.  Contractions: Not present. Vag. Bleeding: None.  Movement: Present. Denies any leaking of fluid.   The following portions of the patient's history were reviewed and updated as appropriate: allergies, current medications, past family history, past medical history, past social history, past surgical history and problem list.   Objective:   Vitals:   05/28/19 0817  BP: 129/74  Pulse: 91    Fetal Status:     Movement: Present     General:  Alert, oriented and cooperative. Patient is in no acute distress.  Respiratory: Normal respiratory effort, no problems with respiration noted  Mental Status: Normal mood and affect. Normal behavior. Normal judgment and thought content.  Rest of physical exam deferred  due to type of encounter  Imaging: No results found.  Assessment and Plan:  Pregnancy: G4P0 at [redacted]w[redacted]d 1. Supervision of high risk pregnancy, antepartum Stable Anatomy scan next week  2. History of pulmonary embolus during pregnancy Stable on Lovenox  3. History of 3 cesarean sections For repeat at 39 weeks   4. History of preterm delivery, currently pregnant D/T to PE  5. Genetic carrier Has been referred to genetics  6. Alpha thalassemia silent carrier Has been referred to genetics  7. Unwanted fertility Needs to sign BTL papers at 28 weeks  Preterm labor symptoms and general obstetric precautions including but not limited to vaginal bleeding, contractions, leaking of fluid and fetal movement were reviewed in detail with the patient. I discussed the assessment and treatment plan with the patient. The patient was provided an opportunity to ask questions and all were answered. The patient agreed with the plan and demonstrated an understanding of the instructions. The patient was advised to call back or seek an in-person office evaluation/go to [facility] at [facility] for any urgent or concerning symptoms. Please refer to After Visit Summary for other counseling recommendations.   I provided 8 minutes of face-to-face time during this encounter.  Return in about 4 weeks (around 06/25/2019) for OB visit, virtual, MD provider.  Future Appointments  Date Time Provider Department Center  05/31/2019  8:00 AM WH-MFC NURSE WH-MFC MFC-US  05/31/2019  8:00 AM WH-MFC [place] 3 WH-MFCUS MFC-US    Hermina Staggers, MD Center for [facility], [facility]

## 2019-05-28 NOTE — Progress Notes (Signed)
I connected with  [name] on 05/28/19 at  8:15 AM EST by telephone and verified that I am speaking with the correct person using two identifiers.   I discussed the limitations, risks, security and privacy concerns of performing an evaluation and management service by telephone and the availability of in person appointments. I also discussed with the patient that there may be a patient responsible charge related to this service. The patient expressed understanding and agreed to proceed.  Henrietta Dine, CMA 05/28/2019  8:20 AM

## 2019-05-31 ENCOUNTER — Other Ambulatory Visit: Payer: Self-pay

## 2019-05-31 ENCOUNTER — Other Ambulatory Visit (HOSPITAL_COMMUNITY): Payer: Self-pay | Admitting: *Deleted

## 2019-05-31 ENCOUNTER — Ambulatory Visit (HOSPITAL_COMMUNITY)
Admission: RE | Admit: 2019-05-31 | Discharge: 2019-05-31 | Disposition: A | Discharge status: Home / Self Care | Payer: BC Managed Care – PPO | Source: Ambulatory Visit | Attending: Obstetrics and Gynecology | Admitting: Obstetrics and Gynecology

## 2019-05-31 ENCOUNTER — Encounter (HOSPITAL_COMMUNITY): Payer: Self-pay

## 2019-05-31 ENCOUNTER — Ambulatory Visit (HOSPITAL_COMMUNITY): Payer: BC Managed Care – PPO | Admitting: *Deleted

## 2019-05-31 DIAGNOSIS — Z3A20 20 weeks gestation of pregnancy: Secondary | ICD-10-CM

## 2019-05-31 DIAGNOSIS — O34219 Maternal care for unspecified type scar from previous cesarean delivery: Secondary | ICD-10-CM | POA: Diagnosis not present

## 2019-05-31 DIAGNOSIS — O99212 Obesity complicating pregnancy, second trimester: Secondary | ICD-10-CM

## 2019-05-31 DIAGNOSIS — Z87898 Personal history of other specified conditions: Secondary | ICD-10-CM | POA: Diagnosis present

## 2019-05-31 DIAGNOSIS — O09292 Supervision of pregnancy with other poor reproductive or obstetric history, second trimester: Secondary | ICD-10-CM | POA: Diagnosis not present

## 2019-05-31 DIAGNOSIS — Z98891 History of uterine scar from previous surgery: Secondary | ICD-10-CM

## 2019-05-31 DIAGNOSIS — O09212 Supervision of pregnancy with history of pre-term labor, second trimester: Secondary | ICD-10-CM | POA: Diagnosis not present

## 2019-05-31 DIAGNOSIS — O09899 Supervision of other high risk pregnancies, unspecified trimester: Secondary | ICD-10-CM | POA: Insufficient documentation

## 2019-05-31 DIAGNOSIS — Z8759 Personal history of other complications of pregnancy, childbirth and the puerperium: Secondary | ICD-10-CM | POA: Insufficient documentation

## 2019-05-31 DIAGNOSIS — O0990 Supervision of high risk pregnancy, unspecified, unspecified trimester: Secondary | ICD-10-CM | POA: Insufficient documentation

## 2019-05-31 DIAGNOSIS — Z86711 Personal history of pulmonary embolism: Secondary | ICD-10-CM

## 2019-06-25 ENCOUNTER — Other Ambulatory Visit: Payer: Self-pay

## 2019-06-25 ENCOUNTER — Encounter: Payer: Self-pay | Admitting: Obstetrics and Gynecology

## 2019-06-25 ENCOUNTER — Telehealth (INDEPENDENT_AMBULATORY_CARE_PROVIDER_SITE_OTHER): Payer: Medicaid Other | Admitting: Obstetrics and Gynecology

## 2019-06-25 DIAGNOSIS — D563 Thalassemia minor: Secondary | ICD-10-CM

## 2019-06-25 DIAGNOSIS — Z148 Genetic carrier of other disease: Secondary | ICD-10-CM

## 2019-06-25 DIAGNOSIS — O0992 Supervision of high risk pregnancy, unspecified, second trimester: Secondary | ICD-10-CM | POA: Diagnosis not present

## 2019-06-25 DIAGNOSIS — O09892 Supervision of other high risk pregnancies, second trimester: Secondary | ICD-10-CM | POA: Diagnosis not present

## 2019-06-25 DIAGNOSIS — Z98891 History of uterine scar from previous surgery: Secondary | ICD-10-CM | POA: Diagnosis not present

## 2019-06-25 DIAGNOSIS — Z86711 Personal history of pulmonary embolism: Secondary | ICD-10-CM

## 2019-06-25 DIAGNOSIS — O0990 Supervision of high risk pregnancy, unspecified, unspecified trimester: Secondary | ICD-10-CM

## 2019-06-25 DIAGNOSIS — O09899 Supervision of other high risk pregnancies, unspecified trimester: Secondary | ICD-10-CM

## 2019-06-25 DIAGNOSIS — Z8759 Personal history of other complications of pregnancy, childbirth and the puerperium: Secondary | ICD-10-CM

## 2019-06-25 DIAGNOSIS — Z3009 Encounter for other general counseling and advice on contraception: Secondary | ICD-10-CM

## 2019-06-25 DIAGNOSIS — Z3A23 23 weeks gestation of pregnancy: Secondary | ICD-10-CM

## 2019-06-25 MED ORDER — ENOXAPARIN SODIUM 40 MG/0.4ML ~~LOC~~ SOLN: 40.0000 mg | SUBCUTANEOUS | 2 refills | Status: DC

## 2019-06-25 NOTE — Progress Notes (Signed)
I connected with  Sophia Olson on 06/25/19 at 10:15 AM EST by telephone and verified that I am speaking with the correct person using two identifiers.   I discussed the limitations, risks, security and privacy concerns of performing an evaluation and management service by telephone and the availability of in person appointments. I also discussed with the patient that there may be a patient responsible charge related to this service. The patient expressed understanding and agreed to proceed.  Henrietta Dine, CMA 06/25/2019  10:11 AM

## 2019-06-25 NOTE — Progress Notes (Signed)
TELEHEALTH OBSTETRICS PRENATAL VIRTUAL VIDEO VISIT ENCOUNTER NOTE  Provider location: [facility] [employer] [place] [facility]   I connected with Sophia Olson on 06/25/19 at 10:15 AM EST by MyChart Video Encounter at home and verified that I am speaking with the correct person using two identifiers.   I discussed the limitations, risks, security and privacy concerns of performing an evaluation and management service virtually and the availability of in person appointments. I also discussed with the patient that there may be a patient responsible charge related to this service. The patient expressed understanding and agreed to proceed. Subjective:  Sophia Olson is a 29 y.o. [phone] at [redacted]w[redacted]d being seen today for ongoing prenatal care.  She is currently monitored for the following issues for this high-risk pregnancy and has Supervision of high risk pregnancy, antepartum; History of pulmonary embolus during pregnancy; History of preterm delivery, currently pregnant; History of low birth weight; History of 3 cesarean sections; Obesity, morbid, BMI 50 or higher (HCC); Unwanted fertility; Alpha thalassemia silent carrier; and Genetic carrier on their problem list.  Patient reports no complaints.  Contractions: Not present. Vag. Bleeding: None.  Movement: Present. Denies any leaking of fluid.   The following portions of the patient's history were reviewed and updated as appropriate: allergies, current medications, past family history, past medical history, past social history, past surgical history and problem list.   Objective:  There were no vitals filed for this visit.  Fetal Status:     Movement: Present     General:  Alert, oriented and cooperative. Patient is in no acute distress.  Respiratory: Normal respiratory effort, no problems with respiration noted  Mental Status: Normal mood and affect. Normal behavior. Normal judgment and thought content.  Rest of physical exam deferred due to  type of encounter  Imaging: [place] MFM OB DETAIL +14 WK  Result Date: 05/31/2019 ----------------------------------------------------------------------  OBSTETRICS REPORT                       (Signed Final 05/31/2019 09:17 am) ---------------------------------------------------------------------- Patient Info  ID #:       [id number]                          D.O.B.:  May 05, 1990 (28 yrs)  Name:       Sophia Olson                Visit Date: 05/31/2019 08:01 am ---------------------------------------------------------------------- Performed By  Performed By:     Lenise Arena        Ref. Address:     [address]                                                             [place], [place]  [address]  Attending:        Noralee Space MD        [facility]  Referred By:      Hermina Staggers                    MD ---------------------------------------------------------------------- Orders   #  Description                          Code         Ordered By   1  [place] MFM OB DETAIL +14 WK              76811.01     Nettie Elm  ----------------------------------------------------------------------   #  Order #                    Accession #                 Episode #   1  [id number]                  [id number]                  [id number]  ---------------------------------------------------------------------- Indications   Encounter for antenatal screening for          Z36.3   malformations (low risk NIPS)   History of cesarean delivery x3 (will have     O34.219   repeat)   Genetic carrier (alpha thal, SMA increased     Z14.8   risk)   Poor obstetric history: Previous preterm       O09.219   delivery (d/t pre-e)   Poor obstetric history: Previous preeclampsia  O09.299   Poor obstetrical  history (Pulmonary            O09.299   embolism - Lovenox)   Obesity complicating pregnancy, second         O99.212   trimester (BMI 53)   [redacted] weeks gestation of pregnancy                Z3A.20  ---------------------------------------------------------------------- Vital Signs  Weight (lb): 327                               Height:        5'6"  BMI:         52.77 ---------------------------------------------------------------------- Fetal Evaluation  Num Of Fetuses:         1  Fetal Heart Rate(bpm):  150  Cardiac Activity:       Observed  Presentation:           Cephalic  Placenta:               Posterior  P. Cord Insertion:      Visualized, central  Amniotic Fluid  AFI FV:      Within normal limits  Largest Pocket(cm)                              3.69 ---------------------------------------------------------------------- Biometry  BPD:      43.3  mm     G. Age:  19w 1d         12  %    CI:        71.32   %    70 - 86                                                          FL/HC:      18.6   %    16.8 - 19.8  HC:      163.3  mm     G. Age:  19w 1d          6  %    HC/AC:      1.08        1.09 - 1.39  AC:      151.5  mm     G. Age:  20w 3d         62  %    FL/BPD:     70.0   %  FL:       30.3  mm     G. Age:  19w 3d         17  %    FL/AC:      20.0   %    20 - 24  CER:      19.4  mm     G. Age:  18w 5d         11  %  LV:        4.4  mm  CM:        6.6  mm  Est. FW:     314  gm    0 lb 11 oz      27  % ---------------------------------------------------------------------- OB History  Gravidity:    4         Term:   2        Prem:   1  Living:       3 ---------------------------------------------------------------------- Gestational Age  LMP:           20w 1d        Date:  01/10/19                 EDD:   10/17/19  U/S Today:     19w 4d                                        EDD:   10/21/19  Best:          20w 1d     Det. By:  LMP  (01/10/19)          EDD:   10/17/19  ---------------------------------------------------------------------- Anatomy  Cranium:               Appears normal         Aortic Arch:            Vita Barley normal  Cavum:                 Appears normal         Ductal Arch:            Not well visualized  Ventricles:            Appears normal         Diaphragm:              Appears normal  Choroid Plexus:        Appears normal         Stomach:                Appears normal, left                                                                        sided  Cerebellum:            Appears normal         Abdomen:                Appears normal  Posterior Fossa:       Appears normal         Abdominal Wall:         Appears nml (cord                                                                        insert, abd wall)  Nuchal Fold:           Not applicable (>20    Cord Vessels:           Appears normal ([redacted]                         wks GA)                                        vessel cord)  Face:                  Orbits nl; profile not Kidneys:                Appear normal                         well visualized  Lips:                  Appears normal         Bladder:                Appears normal  Thoracic:              Appears normal         Spine:                  Appears normal  Heart:                 Not well visualized    Upper Extremities:      Appears normal  RVOT:                  Appears normal         Lower Extremities:      Appears normal  LVOT:                  Appears normal  Other:  Heels and 5th digit visualized. Technically difficult due to maternal          habitus and fetal position. ---------------------------------------------------------------------- Cervix Uterus Adnexa  Cervix  Length:           4.11  cm.  Normal appearance by transabdominal scan. `  Uterus  No abnormality visualized.  Left Ovary  Within normal limits.  Right Ovary  Within normal limits.  Cul De Sac  No free fluid seen.  Adnexa  No abnormality visualized.  ---------------------------------------------------------------------- Impression  Ms. Sophia Olson, G4 P3, is here for fetal anatomy scan.  Past medical history is significant for pulmonary embolism in  2009 in her first pregnancy. Patient takes lovenox 40 mg  once daily. She does not smoke cigarettes and reports being  active.  Obstetric history is significant for 3 previous cesarean  deliveries and all her children are in good health. Her first  pregnancy was complicated by preeclampsia and preterm  indicated cesarean delivery. In her most-recent pregnancy,  she had postpartum hemorrhage and received blood  transfusion [facility]).  Patient is also a silent carrier for alpha thalassemia and has  an increased carrier risk for spinal muscular atrophy. All her  children are unaffected. Carrier status of her partner, father of  her previous 2 children and this pregnancy, is not known.  We performed fetal anatomy scan. No makers of  aneuploidies or fetal structural defects are seen. Fetal  biometry is consistent with her previously-established dates.  Amniotic fluid is normal and good fetal activity is seen.  Patient understands the limitations of ultrasound in detecting  fetal anomalies.  Placenta is posterior and there is no evidence of previa or  accreta.  Maternal obesity imposes limitations on the resolution of  images, and failure to detect fetal anomalies is more common  in obese pregnant women. As maternal obesity makes  clinical assessment of fetal growth difficult, we recommend  serial growth scans until delivery.  I counseled the patient on the benefit of low-dose aspirin that  delays or prevents preeclampsia. Patient is not allergic to  aspirin and I recommended that she take aspirin 81 mg PO  daily till delivery.  I also recommended genetic counseling. ---------------------------------------------------------------------- Recommendations  -An appointment was made for her to return in 4 weeks for   completion of fetal anatomy.  -Serial fetal growth assessments every 4 weeks. ----------------------------------------------------------------------                  Noralee Space, MD Electronically Signed Final Report   05/31/2019 09:17 am ----------------------------------------------------------------------   Assessment and Plan:  Pregnancy: [id number] at [redacted]w[redacted]d 1. Supervision of high risk pregnancy, antepartum Stable Glucola next visit Anatomy scan completed, F/U scheduled for next week  2. History of preterm delivery, currently pregnant D/T to PE, not a candidate for Makena  3. History of 3 cesarean sections For repeat at 39 weeks  4. History of pulmonary embolus during pregnancy Stable Continue  with Lovenox qd - enoxaparin (LOVENOX) 40 MG/0.4ML injection; Inject 0.4 mLs (40 mg total) into the skin daily.  Dispense: 30 mL; Refill: 2  5. Genetic carrier Has been referred to genetic counselor Increased risk for SMA  6. Alpha thalassemia silent carrier Has been referred to genetic counselor  7. Unwanted fertility Sign BTL papers at next appt  Preterm labor symptoms and general obstetric precautions including but not limited to vaginal bleeding, contractions, leaking of fluid and fetal movement were reviewed in detail with the patient. I discussed the assessment and treatment plan with the patient. The patient was provided an opportunity to ask questions and all were answered. The patient agreed with the plan and demonstrated an understanding of the instructions. The patient was advised to call back or seek an in-person office evaluation/[facility] for any urgent or concerning symptoms. Please refer to After Visit Summary for other counseling recommendations.   I provided 10 minutes of face-to-face time during this encounter.  Return in about 4 weeks (around 07/23/2019) for OB visit, fasting appt for glucola and MD provider.  Future Appointments  Date  Time Provider Department Center  06/28/2019  8:45 AM WH-MFC NURSE [facility]-M[facility] MFC-US  06/28/2019  8:45 AM WH-MFC US 2 WH-MFCUS MFC-US  06/28/2019 10:30 AM WH-MFC GENETIC COUNSELING RM WH-MFC MFC-US    Hermina StaggersMichael L Dylynn Ketner, MD [facility]

## 2019-06-25 NOTE — Progress Notes (Signed)
Pt is currently at work, can't take BP right now, asked if she could do so once she gets home and record in my chart,pt does not have BRx.

## 2019-06-28 ENCOUNTER — Encounter (HOSPITAL_COMMUNITY): Payer: Self-pay

## 2019-06-28 ENCOUNTER — Ambulatory Visit (HOSPITAL_COMMUNITY)
Admission: RE | Admit: 2019-06-28 | Discharge: 2019-06-28 | Disposition: A | Discharge status: Home / Self Care | Payer: Medicaid Other | Source: Ambulatory Visit | Attending: Obstetrics and Gynecology | Admitting: Obstetrics and Gynecology

## 2019-06-28 ENCOUNTER — Ambulatory Visit (HOSPITAL_COMMUNITY): Payer: Medicaid Other

## 2019-06-28 ENCOUNTER — Other Ambulatory Visit: Payer: Self-pay

## 2019-06-28 ENCOUNTER — Ambulatory Visit (HOSPITAL_COMMUNITY): Payer: Medicaid Other | Admitting: *Deleted

## 2019-06-28 DIAGNOSIS — O09899 Supervision of other high risk pregnancies, unspecified trimester: Secondary | ICD-10-CM

## 2019-06-28 DIAGNOSIS — Z8759 Personal history of other complications of pregnancy, childbirth and the puerperium: Secondary | ICD-10-CM

## 2019-06-28 DIAGNOSIS — O0990 Supervision of high risk pregnancy, unspecified, unspecified trimester: Secondary | ICD-10-CM | POA: Insufficient documentation

## 2019-06-28 DIAGNOSIS — Z87898 Personal history of other specified conditions: Secondary | ICD-10-CM

## 2019-06-28 DIAGNOSIS — Z86711 Personal history of pulmonary embolism: Secondary | ICD-10-CM

## 2019-06-28 DIAGNOSIS — Z98891 History of uterine scar from previous surgery: Secondary | ICD-10-CM | POA: Insufficient documentation

## 2019-06-28 DIAGNOSIS — O99212 Obesity complicating pregnancy, second trimester: Secondary | ICD-10-CM | POA: Diagnosis not present

## 2019-06-28 DIAGNOSIS — Z362 Encounter for other antenatal screening follow-up: Secondary | ICD-10-CM

## 2019-06-28 DIAGNOSIS — Z3A24 24 weeks gestation of pregnancy: Secondary | ICD-10-CM

## 2019-06-30 ENCOUNTER — Other Ambulatory Visit (HOSPITAL_COMMUNITY): Payer: Self-pay | Admitting: *Deleted

## 2019-06-30 DIAGNOSIS — O99210 Obesity complicating pregnancy, unspecified trimester: Secondary | ICD-10-CM

## 2019-07-01 ENCOUNTER — Ambulatory Visit (HOSPITAL_COMMUNITY): Payer: Medicaid Other | Attending: Obstetrics and Gynecology | Admitting: Genetic Counselor

## 2019-07-01 DIAGNOSIS — Z315 Encounter for genetic counseling: Secondary | ICD-10-CM | POA: Diagnosis not present

## 2019-07-01 DIAGNOSIS — Z3A24 24 weeks gestation of pregnancy: Secondary | ICD-10-CM

## 2019-07-01 DIAGNOSIS — D563 Thalassemia minor: Secondary | ICD-10-CM

## 2019-07-01 DIAGNOSIS — Z148 Genetic carrier of other disease: Secondary | ICD-10-CM | POA: Diagnosis not present

## 2019-07-01 NOTE — Progress Notes (Signed)
07/01/2019  Sophia Olson Sep 25, 1990 MRN: [id number] DOV: 07/01/2019   I connected withMs.Farringtonon3/11/21at9:00 AM ESTbyWebExand verified that I am speaking with the correct person using two identifiers.Ms.Farringtonwas [facility]to [facility] for a genetics consultation regardingher carrier status for alpha-thalassemia and spinal muscular atrophy. Ms. Springsteen presented to her appointment alone.  Indication for genetic counseling - Silent carrier for alpha-thalassemia - Increased risk to be carrier for spinal muscular atrophy  Prenatal history  Ms. Grieb is a [id number], 29 y.o. female. Her current pregnancy has completed [redacted]w[redacted]d(Estimated Date of Delivery: 10/17/19).  Ms. FHeyligerdenied exposure to environmental toxins or chemical agents. She denied the use of alcohol, tobacco or street drugs. She reported taking prenatal vitamins, Lovenox, and baby aspirin. She denied significant viral illnesses, fevers, and bleeding during the course of her pregnancy. Her medical and surgical histories were noncontributory.  Family History  A three generation pedigree was drafted and reviewed. Both family histories were reviewed and found to be noncontributory for birth defects, intellectual disability, recurrent pregnancy loss, and known genetic conditions.    The patient's ethnicity is African American. The father of the pregnancy's ethnicity is African American. Ashkenazi Jewish ancestry and consanguinity were denied. Pedigree will be scanned under Media.  Discussion  Ms. FSaathoffhad Horizon-14 carrier screening performed through [facility] The results of the screen identified her as a silent carrier for alpha-thalassemia (aa/a-). Alpha-thalassemia is different in its inheritance compared to other hemoglobinopathies as there are two copies of two alpha globin genes (HBA1 and HBA2) on each chromosome 16, or four alpha globin genes total (aa/aa). A  person can be a carrier of one alpha gene mutation (aa/a-), also referred to as a "silent carrier". A person who carries two alpha globin gene mutations can either carry them in cis (both on the same chromosome, denoted as aa/--) or in trans (on different chromosomes, denoted as a-/a-). Alpha-thalassemia carriers of two mutations who have African American ancestry are more likely to have a trans arrangement (a-/a-); cis configuration is reported to be rare in individuals with African American ancestry.     There are several different forms of alpha-thalassemia. The most severe form of alpha-thalassemia, Hb Barts, is associated with an absence of alpha globin chain synthesis as a result of deletions of all four alpha globin genes (--/--).  Given that Ms. FLogiudiceis a silent carrier (aa/a-), her pregnancies would not be at increased risk for Hb Barts, even if her partner is a carrier for alpha-thalassemia, as she will always pass on at least one copy of the alpha globin gene to her children. Hemoglobin H (HbH) disease is caused by three deleted or dysfunctioning alpha globin alleles (a-/--) and is characterized by microcytic hypochromic hemolytic anemia, hepatosplenomegaly, mild jaundice, growth retardation, and sometimes thalassemia-like bone changes. Given Ms. Acy's silent carrier status (aa/a-), the current fetus would only be at risk for HbH disease (a-/--), if her partner is a carrier for two alpha globin mutations in cis (aa/--). If this is the case, the risk for HbH disease in the pregnancy would be 1 in 4 (25%). However, if Ms. Beidleman's partner is a carrier for two alpha globin mutations, he would be more likely to carry them in trans configuration (a-/a-) than the cis configuration (aa/--), given his ethnicity. If he is a carrier of alpha-thalassemia in trans, then the pregnancy would not be at increased risk for HbH disease. Based on the carrier frequency for alpha-thalassemia in the African  American  population, Ms. Thai's partner has a 1 in 30 chance of being any type of carrier for alpha-thalassemia.   Ms. Udovich was also found to have 2 copies of the SMN1 gene on Horizon-14 carrier screening; however, she also has the c.*3+80T>G polymorphism of SMN1 in intron 7 (also known as [id number]>G). This puts her at increased risk (1 in 76) to be a silent 2+0 carrier for SMA. SMA is a condition caused by mutations in the SMN1 gene. Typically, individuals have two copies of the SMN1 gene, with one copy present on each chromosome. In SMA silent carriers, both copies of the SMN1 gene are present on one chromosome, with no copies of SMN1 present on the other chromosome.   SMA is characterized by progressive muscle weakness and atrophy due to degeneration and loss of anterior horn cells (lower motor neurons) in the spinal cord and brain stem. We discussed the different types of SMA (0, I, II, and III), including differences in severity and age of onset. We also reviewed the autosomal recessive inheritance pattern of SMA. We discussed that the couple only has a chance of having a child with SMA if both parents are identified as carriers for the condition. Based on the carrier frequency for SMA in the African American population, Ms. Burrough's partner currently has a 1 in 41 chance of being a carrier of SMA. Without partner screening to refine risk and based on her partner's ethnicity, the couple currently has a 1 in 8976 (0.01%) risk of having a child with SMA. If Ms. Mccollom's partner were found to have 2 copies of SMN1, his risk of being a carrier is reduced but not eliminated. However, if both parents are carriers of SMA, there is a 1 in 4 (25%) chance of having an affected fetus.   Ms. Gerety carrier screening was negative for the other 12 conditions screened. Thus, her risk to be a carrier for these additional conditions (listed separately in the laboratory report) has been reduced  but not eliminated. This also significantly reduces her risk of having a child affected by one of these conditions. We discussed that carrier testing for alpha-thalassemia and SMA is recommended for Ms. Bracco's partner. Ms. Talley wished to discuss the option of carrier screening with her partner.  We also reviewed that Ms. Mccloud had Panorama NIPS through the laboratory [facility] that was low-risk for fetal aneuploidies. We reviewed that these results showed a less than 1 in 10,000 risk for trisomies 21, 18 and 13, and monosomy X (Turner syndrome).  In addition, the risk for triploidy and sex chromosome trisomies (47,XXX and 47,XXY) was also low. Ms. Wafer elected to have cfDNA analysis for 22q11.2 deletion syndrome, which was also low risk (1 in 9000). We reviewed that while this testing identifies 94-99% of pregnancies with trisomy 15, trisomy 40, trisomy 40, sex chromosome aneuploidies, and triploidy, it is NOT diagnostic. A positive test result requires confirmation by CVS or amniocentesis, and a negative test result does not rule out a fetal chromosome abnormality. She also understands that this testing does not identify all genetic conditions.  Ms. Berninger was also counseled regarding diagnostic testing via amniocentesis. We discussed the technical aspects of the procedure and quoted up to a 1 in 500 (0.2%) risk for spontaneous pregnancy loss or other adverse pregnancy outcomes as a result of amniocentesis. Cultured cells from an amniocentesis sample allow for the visualization of a fetal karyotype, which can detect >99% of chromosomal aberrations. Chromosomal microarray can also be performed  to identify smaller deletions or duplications of fetal chromosomal material. Amniocentesis could also be performed to assess whether the baby is affected by alpha-thalassemia or SMA. After careful consideration, Ms. Morace declined amniocentesis at this time. She understands that amniocentesis  is available at any point after 16 weeks of pregnancy and that she may opt to undergo the procedure at a later date should she change her mind.  Ms. Tayag was potentially interested in pursuing alpha-thalassemia and SMA carrier screening with her partner, Lottie Rater. However, she requested time to speak with him about this option before making a definitive decision. I informed Ms. Kolarik that if they are interested in pursuing testing, the laboratory can mail a saliva kit to their house. If her partner has health insurance, I can perform a benefits investigation to determine expected out-of-pocket cost prior to ordering the test. She also was made aware that laboratories offer patient-pay options and financial assistance programs. If her partner does not have health insurance, I may be able to facilitate free testing for him. I provided Ms. Divelbiss with my contact information and encouraged her to reach out about their testing decision after speaking with her partner.  I counseled Ms. Ostrander regarding the above risks and available options. The approximate face-to-face time with the genetic counselor was 30 minutes.  In summary:  Discussed carrier screening results and options for follow-up testing  Silent carrier for alpha-thalassemia  Increased risk (1 in 55) to be silent carrier for spinal muscular atrophy  Recommend partner carrier screening. She will contact me if she and her partner is interested in pursuing this  Reviewed low-risk NIPS results  Reduction in risk for Down syndrome,trisomy 18,trisomy 26, sex chromosome aneuploidies, and 22q11.2deletionsyndrome  Offered additional testing and screening  Declined amniocentesis  Reviewed family history concerns   Buelah Manis, MS, Counselling psychologist

## 2019-07-07 ENCOUNTER — Ambulatory Visit (HOSPITAL_COMMUNITY): Payer: Self-pay | Admitting: Obstetrics and Gynecology

## 2019-07-29 ENCOUNTER — Encounter (HOSPITAL_COMMUNITY): Payer: Self-pay

## 2019-07-29 ENCOUNTER — Ambulatory Visit (HOSPITAL_COMMUNITY)
Admission: RE | Admit: 2019-07-29 | Discharge: 2019-07-29 | Disposition: A | Discharge status: Home / Self Care | Payer: Medicaid Other | Source: Ambulatory Visit | Attending: Obstetrics | Admitting: Obstetrics

## 2019-07-29 ENCOUNTER — Ambulatory Visit (HOSPITAL_COMMUNITY): Payer: Medicaid Other | Admitting: *Deleted

## 2019-07-29 ENCOUNTER — Other Ambulatory Visit: Payer: Self-pay

## 2019-07-29 DIAGNOSIS — O09299 Supervision of pregnancy with other poor reproductive or obstetric history, unspecified trimester: Secondary | ICD-10-CM

## 2019-07-29 DIAGNOSIS — O34219 Maternal care for unspecified type scar from previous cesarean delivery: Secondary | ICD-10-CM

## 2019-07-29 DIAGNOSIS — O0990 Supervision of high risk pregnancy, unspecified, unspecified trimester: Secondary | ICD-10-CM | POA: Diagnosis present

## 2019-07-29 DIAGNOSIS — Z86711 Personal history of pulmonary embolism: Secondary | ICD-10-CM | POA: Insufficient documentation

## 2019-07-29 DIAGNOSIS — Z87898 Personal history of other specified conditions: Secondary | ICD-10-CM | POA: Insufficient documentation

## 2019-07-29 DIAGNOSIS — Z8759 Personal history of other complications of pregnancy, childbirth and the puerperium: Secondary | ICD-10-CM | POA: Insufficient documentation

## 2019-07-29 DIAGNOSIS — Z98891 History of uterine scar from previous surgery: Secondary | ICD-10-CM

## 2019-07-29 DIAGNOSIS — Z3A28 28 weeks gestation of pregnancy: Secondary | ICD-10-CM | POA: Diagnosis not present

## 2019-07-29 DIAGNOSIS — Z148 Genetic carrier of other disease: Secondary | ICD-10-CM

## 2019-07-29 DIAGNOSIS — O99210 Obesity complicating pregnancy, unspecified trimester: Secondary | ICD-10-CM | POA: Diagnosis not present

## 2019-07-29 DIAGNOSIS — O09219 Supervision of pregnancy with history of pre-term labor, unspecified trimester: Secondary | ICD-10-CM

## 2019-07-29 DIAGNOSIS — O99213 Obesity complicating pregnancy, third trimester: Secondary | ICD-10-CM

## 2019-07-29 DIAGNOSIS — O09899 Supervision of other high risk pregnancies, unspecified trimester: Secondary | ICD-10-CM | POA: Diagnosis present

## 2019-07-30 ENCOUNTER — Other Ambulatory Visit (HOSPITAL_COMMUNITY): Payer: Self-pay | Admitting: *Deleted

## 2019-07-30 DIAGNOSIS — O99213 Obesity complicating pregnancy, third trimester: Secondary | ICD-10-CM

## 2019-08-31 ENCOUNTER — Ambulatory Visit: Payer: Medicaid Other | Admitting: *Deleted

## 2019-08-31 ENCOUNTER — Other Ambulatory Visit: Payer: Self-pay | Admitting: *Deleted

## 2019-08-31 ENCOUNTER — Ambulatory Visit (HOSPITAL_COMMUNITY): Payer: Medicaid Other | Attending: Obstetrics and Gynecology

## 2019-08-31 ENCOUNTER — Other Ambulatory Visit: Payer: Self-pay

## 2019-08-31 ENCOUNTER — Encounter: Payer: Self-pay | Admitting: *Deleted

## 2019-08-31 DIAGNOSIS — Z362 Encounter for other antenatal screening follow-up: Secondary | ICD-10-CM | POA: Diagnosis not present

## 2019-08-31 DIAGNOSIS — O09213 Supervision of pregnancy with history of pre-term labor, third trimester: Secondary | ICD-10-CM | POA: Diagnosis not present

## 2019-08-31 DIAGNOSIS — O09293 Supervision of pregnancy with other poor reproductive or obstetric history, third trimester: Secondary | ICD-10-CM | POA: Diagnosis not present

## 2019-08-31 DIAGNOSIS — O0990 Supervision of high risk pregnancy, unspecified, unspecified trimester: Secondary | ICD-10-CM

## 2019-08-31 DIAGNOSIS — Z87898 Personal history of other specified conditions: Secondary | ICD-10-CM | POA: Insufficient documentation

## 2019-08-31 DIAGNOSIS — O09899 Supervision of other high risk pregnancies, unspecified trimester: Secondary | ICD-10-CM | POA: Insufficient documentation

## 2019-08-31 DIAGNOSIS — O34219 Maternal care for unspecified type scar from previous cesarean delivery: Secondary | ICD-10-CM | POA: Diagnosis not present

## 2019-08-31 DIAGNOSIS — Z86711 Personal history of pulmonary embolism: Secondary | ICD-10-CM | POA: Diagnosis present

## 2019-08-31 DIAGNOSIS — O99213 Obesity complicating pregnancy, third trimester: Secondary | ICD-10-CM | POA: Diagnosis present

## 2019-08-31 DIAGNOSIS — Z148 Genetic carrier of other disease: Secondary | ICD-10-CM

## 2019-08-31 DIAGNOSIS — Z8759 Personal history of other complications of pregnancy, childbirth and the puerperium: Secondary | ICD-10-CM | POA: Diagnosis present

## 2019-08-31 DIAGNOSIS — Z98891 History of uterine scar from previous surgery: Secondary | ICD-10-CM | POA: Insufficient documentation

## 2019-08-31 DIAGNOSIS — Z3A33 33 weeks gestation of pregnancy: Secondary | ICD-10-CM

## 2019-09-01 ENCOUNTER — Ambulatory Visit (INDEPENDENT_AMBULATORY_CARE_PROVIDER_SITE_OTHER): Payer: Medicaid Other | Admitting: Obstetrics and Gynecology

## 2019-09-01 ENCOUNTER — Encounter: Payer: Self-pay | Admitting: *Deleted

## 2019-09-01 VITALS — BP 130/67 | HR 102 | Wt 359.4 lb

## 2019-09-01 DIAGNOSIS — O09213 Supervision of pregnancy with history of pre-term labor, third trimester: Secondary | ICD-10-CM

## 2019-09-01 DIAGNOSIS — O99891 Other specified diseases and conditions complicating pregnancy: Secondary | ICD-10-CM

## 2019-09-01 DIAGNOSIS — O0990 Supervision of high risk pregnancy, unspecified, unspecified trimester: Secondary | ICD-10-CM

## 2019-09-01 DIAGNOSIS — Z87898 Personal history of other specified conditions: Secondary | ICD-10-CM

## 2019-09-01 DIAGNOSIS — D563 Thalassemia minor: Secondary | ICD-10-CM

## 2019-09-01 DIAGNOSIS — Z8759 Personal history of other complications of pregnancy, childbirth and the puerperium: Secondary | ICD-10-CM

## 2019-09-01 DIAGNOSIS — Z86711 Personal history of pulmonary embolism: Secondary | ICD-10-CM

## 2019-09-01 DIAGNOSIS — Z3A33 33 weeks gestation of pregnancy: Secondary | ICD-10-CM

## 2019-09-01 DIAGNOSIS — Z3009 Encounter for other general counseling and advice on contraception: Secondary | ICD-10-CM

## 2019-09-01 DIAGNOSIS — O99213 Obesity complicating pregnancy, third trimester: Secondary | ICD-10-CM

## 2019-09-01 DIAGNOSIS — O09899 Supervision of other high risk pregnancies, unspecified trimester: Secondary | ICD-10-CM

## 2019-09-01 DIAGNOSIS — Z148 Genetic carrier of other disease: Secondary | ICD-10-CM

## 2019-09-01 DIAGNOSIS — Z98891 History of uterine scar from previous surgery: Secondary | ICD-10-CM

## 2019-09-01 DIAGNOSIS — O34219 Maternal care for unspecified type scar from previous cesarean delivery: Secondary | ICD-10-CM

## 2019-09-01 NOTE — Progress Notes (Signed)
Subjective:  Sophia Olson is a 29 y.o. [phone] at [redacted]w[redacted]d being seen today for ongoing prenatal care.  She is currently monitored for the following issues for this high-risk pregnancy and has Supervision of high risk pregnancy, antepartum; History of pulmonary embolus during pregnancy; History of preterm delivery, currently pregnant; History of low birth weight; History of 3 cesarean sections; Obesity, morbid, BMI 50 or higher (HCC); Unwanted fertility; Alpha thalassemia silent carrier; and Genetic carrier on their problem list.  Patient reports general discomforts of pregnancy.  Contractions: Not present. Vag. Bleeding: None.  Movement: Present. Denies leaking of fluid.   The following portions of the patient's history were reviewed and updated as appropriate: allergies, current medications, past family history, past medical history, past social history, past surgical history and problem list. Problem list updated.  Objective:   Vitals:   09/01/19 1116  BP: 130/67  Pulse: (!) 102  Weight: (!) 359 lb 6.4 oz (163 kg)    Fetal Status: Fetal Heart Rate (bpm): 144   Movement: Present     General:  Alert, oriented and cooperative. Patient is in no acute distress.  Skin: Skin is warm and dry. No rash noted.   Cardiovascular: Normal heart rate noted  Respiratory: Normal respiratory effort, no problems with respiration noted  Abdomen: Soft, gravid, appropriate for gestational age. Pain/Pressure: Present     Pelvic:  Cervical exam deferred        Extremities: Normal range of motion.  Edema: Trace  Mental Status: Normal mood and affect. Normal behavior. Normal judgment and thought content.   Urinalysis:      Assessment and Plan:  Pregnancy: [id number] at [redacted]w[redacted]d  1. Supervision of high risk pregnancy, antepartum Stable Will schedule 1 hr Glucola this week  2. Obesity, morbid, BMI 50 or higher (HCC)   3. History of preterm delivery, currently pregnant Stable No S/Sx at present  4.  History of low birth weight Due to above  Growth scan 08/31/19 75 % F/U growth scan ordered in 4 weeks as per MFM  5. History of 3 cesarean sections For repeat at 39 weeks  6. History of pulmonary embolus during pregnancy Stable on Lovenox  7. Genetic carrier S/P genetic counseling  8. Alpha thalassemia silent carrier S/P genetic counseling  9. Unwanted fertility BTL papers signed today  Preterm labor symptoms and general obstetric precautions including but not limited to vaginal bleeding, contractions, leaking of fluid and fetal movement were reviewed in detail with the patient. Please refer to After Visit Summary for other counseling recommendations.  Return in about 2 weeks (around 09/15/2019) for OB visit, face to face, MD provider.   Hermina Staggers, MD

## 2019-09-01 NOTE — Patient Instructions (Signed)
Third Trimester of Pregnancy The third trimester is from week 28 through week 40 (months 7 through 9). The third trimester is a time when the unborn baby (fetus) is growing rapidly. At the end of the ninth month, the fetus is about 20 inches in length and weighs 6-10 pounds. Body changes during your third trimester Your body will continue to go through many changes during pregnancy. The changes vary from woman to woman. During the third trimester:  Your weight will continue to increase. You can expect to gain 25-35 pounds (11-16 kg) by the end of the pregnancy.  You may begin to get stretch marks on your hips, abdomen, and breasts.  You may urinate more often because the fetus is moving lower into your pelvis and pressing on your bladder.  You may develop or continue to have heartburn. This is caused by increased hormones that slow down muscles in the digestive tract.  You may develop or continue to have constipation because increased hormones slow digestion and cause the muscles that push waste through your intestines to relax.  You may develop hemorrhoids. These are swollen veins (varicose veins) in the rectum that can itch or be painful.  You may develop swollen, bulging veins (varicose veins) in your legs.  You may have increased body aches in the pelvis, back, or thighs. This is due to weight gain and increased hormones that are relaxing your joints.  You may have changes in your hair. These can include thickening of your hair, rapid growth, and changes in texture. Some women also have hair loss during or after pregnancy, or hair that feels dry or thin. Your hair will most likely return to normal after your baby is born.  Your breasts will continue to grow and they will continue to become tender. A yellow fluid (colostrum) may leak from your breasts. This is the first milk you are producing for your baby.  Your belly button may stick out.  You may notice more swelling in your hands,  face, or ankles.  You may have increased tingling or numbness in your hands, arms, and legs. The skin on your belly may also feel numb.  You may feel short of breath because of your expanding uterus.  You may have more problems sleeping. This can be caused by the size of your belly, increased need to urinate, and an increase in your body's metabolism.  You may notice the fetus "dropping," or moving lower in your abdomen (lightening).  You may have increased vaginal discharge.  You may notice your joints feel loose and you may have pain around your pelvic bone. What to expect at prenatal visits You will have prenatal exams every 2 weeks until week 36. Then you will have weekly prenatal exams. During a routine prenatal visit:  You will be weighed to make sure you and the baby are growing normally.  Your blood pressure will be taken.  Your abdomen will be measured to track your baby's growth.  The fetal heartbeat will be listened to.  Any test results from the previous visit will be discussed.  You may have a cervical check near your due date to see if your cervix has softened or thinned (effaced).  You will be tested for Group B streptococcus. This happens between 35 and 37 weeks. Your health care provider may ask you:  What your birth plan is.  How you are feeling.  If you are feeling the baby move.  If you have had any abnormal   symptoms, such as leaking fluid, bleeding, severe headaches, or abdominal cramping.  If you are using any tobacco products, including cigarettes, chewing tobacco, and electronic cigarettes.  If you have any questions. Other tests or screenings that may be performed during your third trimester include:  Blood tests that check for low iron levels (anemia).  Fetal testing to check the health, activity level, and growth of the fetus. Testing is done if you have certain medical conditions or if there are problems during the pregnancy.  Nonstress test  (NST). This test checks the health of your baby to make sure there are no signs of problems, such as the baby not getting enough oxygen. During this test, a belt is placed around your belly. The baby is made to move, and its heart rate is monitored during movement. What is false labor? False labor is a condition in which you feel small, irregular tightenings of the muscles in the womb (contractions) that usually go away with rest, changing position, or drinking water. These are called Braxton Hicks contractions. Contractions may last for hours, days, or even weeks before true labor sets in. If contractions come at regular intervals, become more frequent, increase in intensity, or become painful, you should see your health care provider. What are the signs of labor?  Abdominal cramps.  Regular contractions that start at 10 minutes apart and become stronger and more frequent with time.  Contractions that start on the top of the uterus and spread down to the lower abdomen and back.  Increased pelvic pressure and dull back pain.  A watery or bloody mucus discharge that comes from the vagina.  Leaking of amniotic fluid. This is also known as your "water breaking." It could be a slow trickle or a gush. Let your health care provider know if it has a color or strange odor. If you have any of these signs, call your health care provider right away, even if it is before your due date. Follow these instructions at home: Medicines  Follow your health care provider's instructions regarding medicine use. Specific medicines may be either safe or unsafe to take during pregnancy.  Take a prenatal vitamin that contains at least 600 micrograms (mcg) of folic acid.  If you develop constipation, try taking a stool softener if your health care provider approves. Eating and drinking   Eat a balanced diet that includes fresh fruits and vegetables, whole grains, good sources of protein such as meat, eggs, or tofu,  and low-fat dairy. Your health care provider will help you determine the amount of weight gain that is right for you.  Avoid raw meat and uncooked cheese. These carry germs that can cause birth defects in the baby.  If you have low calcium intake from food, talk to your health care provider about whether you should take a daily calcium supplement.  Eat four or five small meals rather than three large meals a day.  Limit foods that are high in fat and processed sugars, such as fried and sweet foods.  To prevent constipation: ? Drink enough fluid to keep your urine clear or pale yellow. ? Eat foods that are high in fiber, such as fresh fruits and vegetables, whole grains, and beans. Activity  Exercise only as directed by your health care provider. Most women can continue their usual exercise routine during pregnancy. Try to exercise for 30 minutes at least 5 days a week. Stop exercising if you experience uterine contractions.  Avoid heavy lifting.  Do   not exercise in extreme heat or humidity, or at high altitudes.  Wear low-heel, comfortable shoes.  Practice good posture.  You may continue to have sex unless your health care provider tells you otherwise. Relieving pain and discomfort  Take frequent breaks and rest with your legs elevated if you have leg cramps or low back pain.  Take warm sitz baths to soothe any pain or discomfort caused by hemorrhoids. Use hemorrhoid cream if your health care provider approves.  Wear a good support bra to prevent discomfort from breast tenderness.  If you develop varicose veins: ? Wear support pantyhose or compression stockings as told by your healthcare provider. ? Elevate your feet for 15 minutes, 3-4 times a day. Prenatal care  Write down your questions. Take them to your prenatal visits.  Keep all your prenatal visits as told by your health care provider. This is important. Safety  Wear your seat belt at all times when driving.  Make  a list of emergency phone numbers, including numbers for family, friends, the hospital, and police and fire departments. General instructions  Avoid cat litter boxes and soil used by cats. These carry germs that can cause birth defects in the baby. If you have a cat, ask someone to clean the litter box for you.  Do not travel far distances unless it is absolutely necessary and only with the approval of your health care provider.  Do not use hot tubs, steam rooms, or saunas.  Do not drink alcohol.  Do not use any products that contain nicotine or tobacco, such as cigarettes and e-cigarettes. If you need help quitting, ask your health care provider.  Do not use any medicinal herbs or unprescribed drugs. These chemicals affect the formation and growth of the baby.  Do not douche or use tampons or scented sanitary pads.  Do not cross your legs for long periods of time.  To prepare for the arrival of your baby: ? Take prenatal classes to understand, practice, and ask questions about labor and delivery. ? Make a trial run to the hospital. ? Visit the hospital and tour the maternity area. ? Arrange for maternity or paternity leave through employers. ? Arrange for family and friends to take care of pets while you are in the hospital. ? Purchase a rear-facing car seat and make sure you know how to install it in your car. ? Pack your hospital bag. ? Prepare the baby's nursery. Make sure to remove all pillows and stuffed animals from the baby's crib to prevent suffocation.  Visit your dentist if you have not gone during your pregnancy. Use a soft toothbrush to brush your teeth and be gentle when you floss. Contact a health care provider if:  You are unsure if you are in labor or if your water has broken.  You become dizzy.  You have mild pelvic cramps, pelvic pressure, or nagging pain in your abdominal area.  You have lower back pain.  You have persistent nausea, vomiting, or  diarrhea.  You have an unusual or bad smelling vaginal discharge.  You have pain when you urinate. Get help right away if:  Your water breaks before 37 weeks.  You have regular contractions less than 5 minutes apart before 37 weeks.  You have a fever.  You are leaking fluid from your vagina.  You have spotting or bleeding from your vagina.  You have severe abdominal pain or cramping.  You have rapid weight loss or weight gain.  You have   shortness of breath with chest pain.  You notice sudden or extreme swelling of your face, hands, ankles, feet, or legs.  Your baby makes fewer than 10 movements in 2 hours.  You have severe headaches that do not go away when you take medicine.  You have vision changes. Summary  The third trimester is from week 28 through week 40, months 7 through 9. The third trimester is a time when the unborn baby (fetus) is growing rapidly.  During the third trimester, your discomfort may increase as you and your baby continue to gain weight. You may have abdominal, leg, and back pain, sleeping problems, and an increased need to urinate.  During the third trimester your breasts will keep growing and they will continue to become tender. A yellow fluid (colostrum) may leak from your breasts. This is the first milk you are producing for your baby.  False labor is a condition in which you feel small, irregular tightenings of the muscles in the womb (contractions) that eventually go away. These are called Braxton Hicks contractions. Contractions may last for hours, days, or even weeks before true labor sets in.  Signs of labor can include: abdominal cramps; regular contractions that start at 10 minutes apart and become stronger and more frequent with time; watery or bloody mucus discharge that comes from the vagina; increased pelvic pressure and dull back pain; and leaking of amniotic fluid. This information is not intended to replace advice given to you by your  health care provider. Make sure you discuss any questions you have with your health care provider. Document Revised: 07/30/2018 Document Reviewed: 05/14/2016 Elsevier Patient Education  2020 [employer]  

## 2019-09-02 ENCOUNTER — Other Ambulatory Visit: Payer: Self-pay

## 2019-09-02 ENCOUNTER — Other Ambulatory Visit: Payer: BC Managed Care – PPO

## 2019-09-02 DIAGNOSIS — O0990 Supervision of high risk pregnancy, unspecified, unspecified trimester: Secondary | ICD-10-CM

## 2019-09-03 LAB — GLUCOSE TOLERANCE, 1 HOUR: Glucose, 1Hr PP: 101 mg/dL (ref 65–199)

## 2019-09-17 ENCOUNTER — Other Ambulatory Visit (HOSPITAL_COMMUNITY)
Admission: RE | Admit: 2019-09-17 | Discharge: 2019-09-17 | Disposition: A | Discharge status: Home / Self Care | Payer: Medicaid Other | Source: Ambulatory Visit | Attending: Family Medicine | Admitting: Family Medicine

## 2019-09-17 ENCOUNTER — Other Ambulatory Visit: Payer: Self-pay

## 2019-09-17 ENCOUNTER — Ambulatory Visit (INDEPENDENT_AMBULATORY_CARE_PROVIDER_SITE_OTHER): Payer: Medicaid Other | Admitting: Family Medicine

## 2019-09-17 VITALS — BP 107/78 | HR 113 | Wt 362.4 lb

## 2019-09-17 DIAGNOSIS — Z3009 Encounter for other general counseling and advice on contraception: Secondary | ICD-10-CM

## 2019-09-17 DIAGNOSIS — O0993 Supervision of high risk pregnancy, unspecified, third trimester: Secondary | ICD-10-CM

## 2019-09-17 DIAGNOSIS — Z23 Encounter for immunization: Secondary | ICD-10-CM | POA: Diagnosis not present

## 2019-09-17 DIAGNOSIS — O09893 Supervision of other high risk pregnancies, third trimester: Secondary | ICD-10-CM

## 2019-09-17 DIAGNOSIS — O0990 Supervision of high risk pregnancy, unspecified, unspecified trimester: Secondary | ICD-10-CM | POA: Diagnosis not present

## 2019-09-17 DIAGNOSIS — Z8759 Personal history of other complications of pregnancy, childbirth and the puerperium: Secondary | ICD-10-CM

## 2019-09-17 DIAGNOSIS — O09899 Supervision of other high risk pregnancies, unspecified trimester: Secondary | ICD-10-CM

## 2019-09-17 DIAGNOSIS — Z3A35 35 weeks gestation of pregnancy: Secondary | ICD-10-CM

## 2019-09-17 DIAGNOSIS — Z98891 History of uterine scar from previous surgery: Secondary | ICD-10-CM

## 2019-09-17 DIAGNOSIS — Z86711 Personal history of pulmonary embolism: Secondary | ICD-10-CM

## 2019-09-17 NOTE — Progress Notes (Signed)
   PRENATAL VISIT NOTE  Subjective:  Sophia Olson is a 29 y.o. [phone] at [redacted]w[redacted]d being seen today for ongoing prenatal care.  She is currently monitored for the following issues for this high-risk pregnancy and has Supervision of high risk pregnancy, antepartum; History of pulmonary embolus during pregnancy; History of preterm delivery, currently pregnant; History of low birth weight; History of 3 cesarean sections; Obesity, morbid, BMI 50 or higher (HCC); Unwanted fertility; Alpha thalassemia silent carrier; and Genetic carrier on their problem list.  Patient reports no complaints.  Contractions: Not present. Vag. Bleeding: None.  Movement: Present. Denies leaking of fluid.   The following portions of the patient's history were reviewed and updated as appropriate: allergies, current medications, past family history, past medical history, past social history, past surgical history and problem list.   Objective:   Vitals:   09/17/19 0932  BP: 107/78  Pulse: (!) 113  Weight: (!) 362 lb 6.4 oz (164.4 kg)    Fetal Status: Fetal Heart Rate (bpm): 143 Fundal Height: 38 cm Movement: Present     General:  Alert, oriented and cooperative. Patient is in no acute distress.  Skin: Skin is warm and dry. No rash noted.   Cardiovascular: Normal heart rate noted  Respiratory: Normal respiratory effort, no problems with respiration noted  Abdomen: Soft, gravid, appropriate for gestational age.  Pain/Pressure: Absent     Pelvic: Cervical exam performed in the presence of a chaperone Dilation: Fingertip Effacement (%): 30 Station: -2  Extremities: Normal range of motion.  Edema: Trace  Mental Status: Normal mood and affect. Normal behavior. Normal judgment and thought content.   Assessment and Plan:  Pregnancy: [id number] at [redacted]w[redacted]d 1. Supervision of high risk pregnancy, antepartum Cultures today - Culture, beta strep (group b only) - GC/Chlamydia probe amp ([place])not at [facility]  2. History of 3  cesarean sections Booked for RCS with BTL with 2 attending given BMI, h/o pph Would strongly consider [facility]  3. History of preterm delivery, currently pregnant No s/sx's or PTL at this time  4. History of pulmonary embolus during pregnancy On lovenox, continue this, advised to stop 24 hours prior to surgery.  5. Unwanted fertility Papers signed 5/12  Preterm labor symptoms and general obstetric precautions including but not limited to vaginal bleeding, contractions, leaking of fluid and fetal movement were reviewed in detail with the patient. Please refer to After Visit Summary for other counseling recommendations.   Return in 1 week (on 09/24/2019) for virtual.  Future Appointments  Date Time Provider Department Center  09/27/2019 10:15 AM Malachy Chamber, MD [facility]  09/29/2019  3:45 PM [facility] NURSE [facility]  09/29/2019  3:45 PM [facility] US4 [facility]  10/05/2019  3:55 PM Marlowe Alt, DO [facility]  10/12/2019  1:35 PM Nugent, Odie Sera, NP [facility]    Reva Bores, MD

## 2019-09-17 NOTE — Patient Instructions (Signed)

## 2019-09-21 ENCOUNTER — Other Ambulatory Visit: Payer: Self-pay | Admitting: Obstetrics and Gynecology

## 2019-09-21 LAB — GC/CHLAMYDIA PROBE AMP (~~LOC~~) NOT AT ARMC
Chlamydia: NEGATIVE
Comment: NEGATIVE
Comment: NORMAL
Neisseria Gonorrhea: NEGATIVE

## 2019-09-21 LAB — CULTURE, BETA STREP (GROUP B ONLY): Strep Gp B Culture: NEGATIVE

## 2019-09-27 ENCOUNTER — Other Ambulatory Visit: Payer: Self-pay

## 2019-09-27 ENCOUNTER — Encounter: Payer: Self-pay | Admitting: Obstetrics & Gynecology

## 2019-09-27 ENCOUNTER — Telehealth (INDEPENDENT_AMBULATORY_CARE_PROVIDER_SITE_OTHER): Payer: Medicaid Other | Admitting: Obstetrics & Gynecology

## 2019-09-27 DIAGNOSIS — O0993 Supervision of high risk pregnancy, unspecified, third trimester: Secondary | ICD-10-CM | POA: Diagnosis not present

## 2019-09-27 DIAGNOSIS — Z8759 Personal history of other complications of pregnancy, childbirth and the puerperium: Secondary | ICD-10-CM

## 2019-09-27 DIAGNOSIS — Z98891 History of uterine scar from previous surgery: Secondary | ICD-10-CM

## 2019-09-27 DIAGNOSIS — O09893 Supervision of other high risk pregnancies, third trimester: Secondary | ICD-10-CM

## 2019-09-27 DIAGNOSIS — Z86711 Personal history of pulmonary embolism: Secondary | ICD-10-CM

## 2019-09-27 DIAGNOSIS — O99213 Obesity complicating pregnancy, third trimester: Secondary | ICD-10-CM

## 2019-09-27 DIAGNOSIS — Z3A37 37 weeks gestation of pregnancy: Secondary | ICD-10-CM

## 2019-09-27 DIAGNOSIS — Z87898 Personal history of other specified conditions: Secondary | ICD-10-CM

## 2019-09-27 DIAGNOSIS — O0990 Supervision of high risk pregnancy, unspecified, unspecified trimester: Secondary | ICD-10-CM

## 2019-09-27 DIAGNOSIS — O09899 Supervision of other high risk pregnancies, unspecified trimester: Secondary | ICD-10-CM

## 2019-09-27 NOTE — Progress Notes (Signed)
I connected with  [name] on 09/27/19 at 10:15 AM EDT by telephone and verified that I am speaking with the correct person using two identifiers.   I discussed the limitations, risks, security and privacy concerns of performing an evaluation and management service by telephone and the availability of in person appointments. I also discussed with the patient that there may be a patient responsible charge related to this service. The patient expressed understanding and agreed to proceed.  Janene Madeira Tensley Wery, CMA 09/27/2019  9:15 AM   Not Home to take Blood pressure states she's been taking it regular and its been normal.

## 2019-09-27 NOTE — Patient Instructions (Signed)
Cesarean Delivery Cesarean birth, or cesarean delivery, is the surgical delivery of a baby through an incision in the abdomen and the uterus. This may be referred to as a C-section. This procedure may be scheduled ahead of time, or it may be done in an emergency situation. Tell a health care provider about:  Any allergies you have.  All medicines you are taking, including vitamins, herbs, eye drops, creams, and over-the-counter medicines.  Any problems you or family members have had with anesthetic medicines.  Any blood disorders you have.  Any surgeries you have had.  Any medical conditions you have.  Whether you or any members of your family have a history of deep vein thrombosis (DVT) or pulmonary embolism (PE). What are the risks? Generally, this is a safe procedure. However, problems may occur, including:  Infection.  Bleeding.  Allergic reactions to medicines.  Damage to other structures or organs.  Blood clots.  Injury to your baby. What happens before the procedure? General instructions  Follow instructions from your health care provider about eating or drinking restrictions.  If you know that you are going to have a cesarean delivery, do not shave your pubic area. Shaving before the procedure may increase your risk of infection.  Plan to have someone take you home from the hospital.  Ask your health care provider what steps will be taken to prevent infection. These may include: ? Removing hair at the surgery site. ? Washing skin with a germ-killing soap. ? Taking antibiotic medicine.  Depending on the reason for your cesarean delivery, you may have a physical exam or additional testing, such as an ultrasound.  You may have your blood or urine tested. Questions for your health care provider  Ask your health care provider about: ? Changing or stopping your regular medicines. This is especially important if you are taking diabetes medicines or blood  thinners. ? Your pain management plan. This is especially important if you plan to breastfeed your baby. ? How long you will be in the hospital after the procedure. ? Any concerns you may have about receiving blood products, if you need them during the procedure. ? Cord blood banking, if you plan to collect your baby's umbilical cord blood.  You may also want to ask your health care provider: ? Whether you will be able to hold or breastfeed your baby while you are still in the operating room. ? Whether your baby can stay with you immediately after the procedure and during your recovery. ? Whether a family member or a person of your choice can go with you into the operating room and stay with you during the procedure, immediately after the procedure, and during your recovery. What happens during the procedure?   An IV will be inserted into one of your veins.  Fluid and medicines, such as antibiotics, will be given before the surgery.  Fetal monitors will be placed on your abdomen to check your baby's heart rate.  You may be given a special warming gown to wear to keep your temperature stable.  A catheter may be inserted into your bladder through your urethra. This drains your urine during the procedure.  You may be given one or more of the following: ? A medicine to numb the area (local anesthetic). ? A medicine to make you fall asleep (general anesthetic). ? A medicine (regional anesthetic) that is injected into your back or through a small thin tube placed in your back (spinal anesthetic or epidural anesthetic).   This numbs everything below the injection site and allows you to stay awake during your procedure. If this makes you feel nauseous, tell your health care provider. Medicines will be available to help reduce any nausea you may feel.  An incision will be made in your abdomen, and then in your uterus.  If you are awake during your procedure, you may feel tugging and pulling in  your abdomen, but you should not feel pain. If you feel pain, tell your health care provider immediately.  Your baby will be removed from your uterus. You may feel more pressure or pushing while this happens.  Immediately after birth, your baby will be dried and kept warm. You may be able to hold and breastfeed your baby.  The umbilical cord may be clamped and cut during this time. This usually occurs after waiting a period of 1-2 minutes after delivery.  Your placenta will be removed from your uterus.  Your incisions will be closed with stitches (sutures). Staples, skin glue, or adhesive strips may also be applied to the incision in your abdomen.  Bandages (dressings) may be placed over the incision in your abdomen. The procedure may vary among health care providers and hospitals. What happens after the procedure?  Your blood pressure, heart rate, breathing rate, and blood oxygen level will be monitored until you are discharged from the hospital.  You may continue to receive fluids and medicines through an IV.  You will have some pain. Medicines will be available to help control your pain.  To help prevent blood clots: ? You may be given medicines. ? You may have to wear compression stockings or devices. ? You will be encouraged to walk around when you are able.  Hospital staff will encourage and support bonding with your baby. Your hospital may have you and your baby to stay in the same room (rooming in) during your hospital stay to encourage successful bonding and breastfeeding.  You may be encouraged to cough and breathe deeply often. This helps to prevent lung problems.  If you have a catheter draining your urine, it will be removed as soon as possible after your procedure. Summary  Cesarean birth, or cesarean delivery, is the surgical delivery of a baby through an incision in the abdomen and the uterus.  Follow instructions from your health care provider about eating or  drinking restrictions before the procedure.  You will have some pain after the procedure. Medicines will be available to help control your pain.  Hospital staff will encourage and support bonding with your baby after the procedure. Your hospital may have you and your baby to stay in the same room (rooming in) during your hospital stay to encourage successful bonding and breastfeeding. This information is not intended to replace advice given to you by your health care provider. Make sure you discuss any questions you have with your health care provider. Document Revised: 10/13/2017 Document Reviewed: 10/13/2017 Elsevier Patient Education  2020 Elsevier Inc.  

## 2019-09-27 NOTE — Progress Notes (Signed)
   TELEHEALTH OBSTETRICS VISIT ENCOUNTER NOTE  I connected with [name] on 09/27/19 at 10:15 AM EDT by telephone at home and verified that I am speaking with the correct person using two identifiers.   I discussed the limitations, risks, security and privacy concerns of performing an evaluation and management service by telephone and the availability of in person appointments. I also discussed with the patient that there may be a patient responsible charge related to this service. The patient expressed understanding and agreed to proceed.  Subjective:  Sophia Olson is a 29 y.o. [phone] at [redacted]w[redacted]d being followed for ongoing prenatal care.  She is currently monitored for the following issues for this high-risk pregnancy and has Supervision of high risk pregnancy, antepartum; History of pulmonary embolus during pregnancy; History of preterm delivery, currently pregnant; History of low birth weight; History of 3 cesarean sections; Obesity, morbid, BMI 50 or higher (HCC); Unwanted fertility; Alpha thalassemia silent carrier; and Genetic carrier on their problem list.  Patient reports no complaints. Reports fetal movement. Denies any contractions, bleeding or leaking of fluid.   The following portions of the patient's history were reviewed and updated as appropriate: allergies, current medications, past family history, past medical history, past social history, past surgical history and problem list.   Objective:   General:  Alert, oriented and cooperative.   Mental Status: Normal mood and affect perceived. Normal judgment and thought content.  Rest of physical exam deferred due to type of encounter  Assessment and Plan:  Pregnancy: [id number] at [redacted]w[redacted]d 1. Supervision of high risk pregnancy, antepartum Precautions given, FM labor precautions reviewed  2. Obesity, morbid, BMI 50 or higher (HCC) Recommend provena wound vac at time of C-section decrease wound dehiscence   3. History of  preterm delivery, currently pregnant  4. History of low birth weight  5. History of 3 cesarean sections R/p C/S scheduled 6/24 w/ BTL  6. History of pulmonary embolus during pregnancy On lovenox   Term labor symptoms and general obstetric precautions including but not limited to vaginal bleeding, contractions, leaking of fluid and fetal movement were reviewed in detail with the patient.  I discussed the assessment and treatment plan with the patient. The patient was provided an opportunity to ask questions and all were answered. The patient agreed with the plan and demonstrated an understanding of the instructions. The patient was advised to call back or seek an in-person office evaluation/go to MAU at [facility] for any urgent or concerning symptoms. Please refer to After Visit Summary for other counseling recommendations.   I provided 8 minutes of non-face-to-face time during this encounter.  No follow-ups on file.  Future Appointments  Date Time Provider Department Center  09/27/2019 10:15 AM Malachy Chamber, MD [facility]  09/29/2019  3:45 PM [facility] NURSE [facility]  09/29/2019  3:45 PM [facility] US4 [facility]  10/05/2019  3:55 PM Marlowe Alt, DO [facility]  10/12/2019  1:35 PM Nugent, Odie Sera, NP [facility]    Malachy Chamber, MD Center for [facility], [facility]

## 2019-09-29 ENCOUNTER — Ambulatory Visit: Payer: Medicaid Other | Admitting: *Deleted

## 2019-09-29 ENCOUNTER — Ambulatory Visit: Payer: Medicaid Other | Attending: Obstetrics and Gynecology

## 2019-09-29 ENCOUNTER — Other Ambulatory Visit: Payer: Self-pay

## 2019-09-29 DIAGNOSIS — Z87898 Personal history of other specified conditions: Secondary | ICD-10-CM | POA: Diagnosis present

## 2019-09-29 DIAGNOSIS — Z362 Encounter for other antenatal screening follow-up: Secondary | ICD-10-CM

## 2019-09-29 DIAGNOSIS — O09899 Supervision of other high risk pregnancies, unspecified trimester: Secondary | ICD-10-CM

## 2019-09-29 DIAGNOSIS — Z8759 Personal history of other complications of pregnancy, childbirth and the puerperium: Secondary | ICD-10-CM | POA: Diagnosis present

## 2019-09-29 DIAGNOSIS — Z148 Genetic carrier of other disease: Secondary | ICD-10-CM

## 2019-09-29 DIAGNOSIS — O09213 Supervision of pregnancy with history of pre-term labor, third trimester: Secondary | ICD-10-CM | POA: Diagnosis not present

## 2019-09-29 DIAGNOSIS — O99213 Obesity complicating pregnancy, third trimester: Secondary | ICD-10-CM

## 2019-09-29 DIAGNOSIS — O09293 Supervision of pregnancy with other poor reproductive or obstetric history, third trimester: Secondary | ICD-10-CM | POA: Diagnosis not present

## 2019-09-29 DIAGNOSIS — Z86711 Personal history of pulmonary embolism: Secondary | ICD-10-CM | POA: Insufficient documentation

## 2019-09-29 DIAGNOSIS — O0990 Supervision of high risk pregnancy, unspecified, unspecified trimester: Secondary | ICD-10-CM | POA: Diagnosis present

## 2019-09-29 DIAGNOSIS — Z98891 History of uterine scar from previous surgery: Secondary | ICD-10-CM | POA: Insufficient documentation

## 2019-09-29 DIAGNOSIS — O34219 Maternal care for unspecified type scar from previous cesarean delivery: Secondary | ICD-10-CM | POA: Diagnosis not present

## 2019-09-29 DIAGNOSIS — Z3A37 37 weeks gestation of pregnancy: Secondary | ICD-10-CM

## 2019-10-05 ENCOUNTER — Other Ambulatory Visit: Payer: Self-pay

## 2019-10-05 ENCOUNTER — Telehealth (INDEPENDENT_AMBULATORY_CARE_PROVIDER_SITE_OTHER): Payer: Medicaid Other | Admitting: Family Medicine

## 2019-10-05 NOTE — Progress Notes (Signed)
@  355pm LVM with appointment information to call back for appointment  @405  pm Lvm with appointment information to give the office a call back. @418pm  no answer

## 2019-10-05 NOTE — Patient Instructions (Signed)
Sophia Olson  10/05/2019   Your procedure is scheduled on:  10/14/2019  Arrive at 0800 at Entrance C on [address] at [facility]  and [employer]. You are invited to use the FREE valet parking or use the Visitor's parking deck.  Pick up the phone at the desk and dial [phone].  Call this number if you have problems the morning of surgery: [phone]  Remember:   Do not eat food:(After Midnight) Desps de medianoche.  Do not drink clear liquids: (After Midnight) Desps de medianoche.  Take these medicines the morning of surgery with A SIP OF WATER:  Stop lovenox 24 hours before Cesarean Section   Do not wear jewelry, make-up or nail polish.  Do not wear lotions, powders, or perfumes. Do not wear deodorant.  Do not shave 48 hours prior to surgery.  Do not bring valuables to the hospital.  [facility] is not   responsible for any belongings or valuables brought to the hospital.  Contacts, dentures or bridgework may not be worn into surgery.  Leave suitcase in the car. After surgery it may be brought to your room.  For patients admitted to the hospital, checkout time is 11:00 AM the day of              discharge.      Please read over the following fact sheets that you were given:     Preparing for Surgery

## 2019-10-05 NOTE — Progress Notes (Signed)
Patient no-showed for virtual visit  Kyley Laurel, Margarette Asal, DO OB Fellow, [facility] 10/05/2019 4:17 PM

## 2019-10-06 ENCOUNTER — Encounter (HOSPITAL_COMMUNITY): Payer: Self-pay

## 2019-10-12 ENCOUNTER — Other Ambulatory Visit: Payer: Self-pay

## 2019-10-12 ENCOUNTER — Other Ambulatory Visit (HOSPITAL_COMMUNITY)
Admission: RE | Admit: 2019-10-12 | Discharge: 2019-10-12 | Disposition: A | Discharge status: Home / Self Care | Payer: Medicaid Other | Source: Ambulatory Visit | Attending: Obstetrics & Gynecology | Admitting: Obstetrics & Gynecology

## 2019-10-12 ENCOUNTER — Encounter: Payer: Self-pay | Admitting: Women's Health

## 2019-10-12 ENCOUNTER — Telehealth (INDEPENDENT_AMBULATORY_CARE_PROVIDER_SITE_OTHER): Payer: Medicaid Other | Admitting: Women's Health

## 2019-10-12 DIAGNOSIS — Z3A39 39 weeks gestation of pregnancy: Secondary | ICD-10-CM

## 2019-10-12 DIAGNOSIS — Z20822 Contact with and (suspected) exposure to covid-19: Secondary | ICD-10-CM | POA: Insufficient documentation

## 2019-10-12 DIAGNOSIS — O99213 Obesity complicating pregnancy, third trimester: Secondary | ICD-10-CM

## 2019-10-12 DIAGNOSIS — O34219 Maternal care for unspecified type scar from previous cesarean delivery: Secondary | ICD-10-CM

## 2019-10-12 DIAGNOSIS — O09899 Supervision of other high risk pregnancies, unspecified trimester: Secondary | ICD-10-CM

## 2019-10-12 DIAGNOSIS — O0990 Supervision of high risk pregnancy, unspecified, unspecified trimester: Secondary | ICD-10-CM

## 2019-10-12 DIAGNOSIS — O0993 Supervision of high risk pregnancy, unspecified, third trimester: Secondary | ICD-10-CM

## 2019-10-12 DIAGNOSIS — D563 Thalassemia minor: Secondary | ICD-10-CM

## 2019-10-12 DIAGNOSIS — Z87898 Personal history of other specified conditions: Secondary | ICD-10-CM

## 2019-10-12 DIAGNOSIS — Z98891 History of uterine scar from previous surgery: Secondary | ICD-10-CM

## 2019-10-12 DIAGNOSIS — Z3009 Encounter for other general counseling and advice on contraception: Secondary | ICD-10-CM

## 2019-10-12 DIAGNOSIS — Z8759 Personal history of other complications of pregnancy, childbirth and the puerperium: Secondary | ICD-10-CM

## 2019-10-12 DIAGNOSIS — Z86711 Personal history of pulmonary embolism: Secondary | ICD-10-CM

## 2019-10-12 DIAGNOSIS — Z148 Genetic carrier of other disease: Secondary | ICD-10-CM

## 2019-10-12 HISTORY — DX: Personal history of other complications of pregnancy, childbirth and the puerperium: Z87.59

## 2019-10-12 LAB — CBC
HCT: 32.9 % — ABNORMAL LOW (ref 36.0–46.0)
Hemoglobin: 9.9 g/dL — ABNORMAL LOW (ref 12.0–15.0)
MCH: 23 pg — ABNORMAL LOW (ref 26.0–34.0)
MCHC: 30.1 g/dL (ref 30.0–36.0)
MCV: 76.5 fL — ABNORMAL LOW (ref 80.0–100.0)
Platelets: 246 10*3/uL (ref 150–400)
RBC: 4.3 MIL/uL (ref 3.87–5.11)
RDW: 14.9 % (ref 11.5–15.5)
WBC: 5.5 10*3/uL (ref 4.0–10.5)
nRBC: 0 % (ref 0.0–0.2)

## 2019-10-12 LAB — TYPE AND SCREEN
ABO/RH(D): O POS
Antibody Screen: NEGATIVE

## 2019-10-12 LAB — ABO/RH: ABO/RH(D): O POS

## 2019-10-12 LAB — SARS CORONAVIRUS 2 (TAT 6-24 HRS): SARS Coronavirus 2: NEGATIVE

## 2019-10-12 NOTE — Patient Instructions (Addendum)
Maternity Assessment Unit (MAU)  The Maternity Assessment Unit (MAU) is located at the [facility] at [facility]. The address is: [address], [address], [place], [place] [address]. Please see map below for additional directions.    The Maternity Assessment Unit is designed to help you during your pregnancy, and for up to 6 weeks after delivery, with any pregnancy- or postpartum-related emergencies, if you think you are in labor, or if your water has broken. For example, if you experience nausea and vomiting, vaginal bleeding, severe abdominal or pelvic pain, elevated blood pressure or other problems related to your pregnancy or postpartum time, please come to the Maternity Assessment Unit for assistance.        Signs and Symptoms of Labor Labor is your body's natural process of moving your baby, placenta, and umbilical cord out of your uterus. The process of labor usually starts when your baby is full-term, between 37 and 40 weeks of pregnancy. How will I know when I am close to going into labor? As your body prepares for labor and the birth of your baby, you may notice the following symptoms in the weeks and days before true labor starts:  Having a strong desire to get your home ready to receive your new baby. This is called nesting. Nesting may be a sign that labor is approaching, and it may occur several weeks before birth. Nesting may involve cleaning and organizing your home.  Passing a small amount of thick, bloody mucus out of your vagina (normal bloody show or losing your mucus plug). This may happen more than a week before labor begins, or it might occur right before labor begins as the opening of the cervix starts to widen (dilate). For some women, the entire mucus plug passes at once. For others, smaller portions of the mucus plug may gradually pass over several days.  Your baby moving (dropping) lower in your pelvis to get into position for birth  (lightening). When this happens, you may feel more pressure on your bladder and pelvic bone and less pressure on your ribs. This may make it easier to breathe. It may also cause you to need to urinate more often and have problems with bowel movements.  Having "practice contractions" (Braxton Hicks contractions) that occur at irregular (unevenly spaced) intervals that are more than 10 minutes apart. This is also called false labor. False labor contractions are common after exercise or sexual activity, and they will stop if you change position, rest, or drink fluids. These contractions are usually mild and do not get stronger over time. They may feel like: ? A backache or back pain. ? Mild cramps, similar to menstrual cramps. ? Tightening or pressure in your abdomen. Other early symptoms that labor may be starting soon include:  Nausea or loss of appetite.  Diarrhea.  Having a sudden burst of energy, or feeling very tired.  Mood changes.  Having trouble sleeping. How will I know when labor has begun? Signs that true labor has begun may include:  Having contractions that come at regular (evenly spaced) intervals and increase in intensity. This may feel like more intense tightening or pressure in your abdomen that moves to your back. ? Contractions may also feel like rhythmic pain in your upper thighs or back that comes and goes at regular intervals. ? For first-time mothers, this change in intensity of contractions often occurs at a more gradual pace. ? Women who have given birth before may notice   a more rapid progression of contraction changes.  Having a feeling of pressure in the vaginal area.  Your water breaking (rupture of membranes). This is when the sac of fluid that surrounds your baby breaks. When this happens, you will notice fluid leaking from your vagina. This may be clear or blood-tinged. Labor usually starts within 24 hours of your water breaking, but it may take longer to  begin. ? Some women notice this as a gush of fluid. ? Others notice that their underwear repeatedly becomes damp. Follow these instructions at home:   When labor starts, or if your water breaks, call your health care provider or nurse care line. Based on your situation, they will determine when you should go in for an exam.  When you are in early labor, you may be able to rest and manage symptoms at home. Some strategies to try at home include: ? Breathing and relaxation techniques. ? Taking a warm bath or shower. ? Listening to music. ? Using a heating pad on the lower back for pain. If you are directed to use heat:  Place a towel between your skin and the heat source.  Leave the heat on for 20-30 minutes.  Remove the heat if your skin turns bright red. This is especially important if you are unable to feel pain, heat, or cold. You may have a greater risk of getting burned. Get help right away if:  You have painful, regular contractions that are 5 minutes apart or less.  Labor starts before you are [redacted] weeks along in your pregnancy.  You have a fever.  You have a headache that does not go away.  You have bright red blood coming from your vagina.  You do not feel your baby moving.  You have a sudden onset of: ? Severe headache with vision problems. ? Nausea, vomiting, or diarrhea. ? Chest pain or shortness of breath. These symptoms may be an emergency. If your health care provider recommends that you go to the hospital or birth center where you plan to deliver, do not drive yourself. Have someone else drive you, or call emergency services (911 in the [place] Summary  Labor is your body's natural process of moving your baby, placenta, and umbilical cord out of your uterus.  The process of labor usually starts when your baby is full-term, between 37 and 40 weeks of pregnancy.  When labor starts, or if your water breaks, call your health care provider or nurse care line. Based  on your situation, they will determine when you should go in for an exam. This information is not intended to replace advice given to you by your health care provider. Make sure you discuss any questions you have with your health care provider. Document Revised: 01/06/2017 Document Reviewed: 09/13/2016 Elsevier Patient Education  2020 Elsevier Inc.  

## 2019-10-12 NOTE — Anesthesia Preprocedure Evaluation (Addendum)
Anesthesia Evaluation  Patient identified by MRN, date of birth, ID band Patient awake    Reviewed: Allergy & Precautions, NPO status , Patient's Chart, lab work & pertinent test results  History of Anesthesia Complications Negative for: history of anesthetic complications  Airway Mallampati: II  TM Distance: >3 FB Neck ROM: Full    Dental no notable dental hx.    Pulmonary PE (2009, on lovenox (last dose 2 days ago))   Pulmonary exam normal        Cardiovascular negative cardio ROS Normal cardiovascular exam     Neuro/Psych Seizures - (childhood seizures), Well Controlled,  negative psych ROS   GI/Hepatic negative GI ROS, Neg liver ROS,   Endo/Other  Morbid obesity (BMI 55)  Renal/GU negative Renal ROS  negative genitourinary   Musculoskeletal negative musculoskeletal ROS (+)   Abdominal   Peds  Hematology  (+) anemia , Hgb 9.9, plt 246   Anesthesia Other Findings Day of surgery medications reviewed with patient.  Reproductive/Obstetrics (+) Pregnancy (Hx of C/S x3)                           Anesthesia Physical Anesthesia Plan  ASA: III  Anesthesia Plan: Combined Spinal and Epidural   Post-op Pain Management:    Induction:   PONV Risk Score and Plan: 3 and Treatment may vary due to age or medical condition, Ondansetron and Dexamethasone  Airway Management Planned: Natural Airway  Additional Equipment:   Intra-op Plan:   Post-operative Plan:   Informed Consent: I have reviewed the patients History and Physical, chart, labs and discussed the procedure including the risks, benefits and alternatives for the proposed anesthesia with the patient or authorized representative who has indicated his/her understanding and acceptance.       Plan Discussed with: CRNA  Anesthesia Plan Comments:       Anesthesia Quick Evaluation

## 2019-10-12 NOTE — Progress Notes (Signed)
I connected with Sophia Olson on 10/12/19 at  1:35 PM EDT by: MyChart video and verified that I am speaking with the correct person using two identifiers.  Patient is located at home and provider is located at [facility].     The purpose of this virtual visit is to provide medical care while limiting exposure to the novel coronavirus. I discussed the limitations, risks, security and privacy concerns of performing an evaluation and management service by MyChart video and the availability of in person appointments. I also discussed with the patient that there may be a patient responsible charge related to this service. By engaging in this virtual visit, you consent to the provision of healthcare.  Additionally, you authorize for your insurance to be billed for the services provided during this visit.  The patient expressed understanding and agreed to proceed.  The following staff members participated in the virtual visit:  Donia Ast, [facility]    PRENATAL VISIT NOTE  Subjective:  Sophia Olson is a 29 y.o. [phone] at [redacted]w[redacted]d  for phone visit for ongoing prenatal care.  She is currently monitored for the following issues for this high-risk pregnancy and has Supervision of high risk pregnancy, antepartum; History of pulmonary embolus during pregnancy; History of preterm delivery, currently pregnant; History of low birth weight; History of 3 cesarean sections; Obesity, morbid, BMI 50 or higher (HCC); Unwanted fertility; Alpha thalassemia silent carrier; and Genetic carrier on their problem list.   Patient reports no complaints.  Contractions: Not present. Vag. Bleeding: None.  Movement: Present. Denies leaking of fluid.   The following portions of the patient's history were reviewed and updated as appropriate: allergies, current medications, past family history, past medical history, past social history, past surgical history and problem list.   Objective:  There were no vitals filed for this  visit.  Fetal Status:     Movement: Present     Assessment and Plan:  Pregnancy: [id number] at [redacted]w[redacted]d  1. Supervision of high risk pregnancy, antepartum -pt unable to locate BP cuff at this time d/t move, but will send [place] a MyChart message with BP reading in next 24hours once unpacked  2. History of 3 cesarean sections -C/S scheduled 10/14/2019 -pt advised to seek care in hospital ASAP with s/sx of labor or ROM  3. Alpha thalassemia silent carrier -GC 07/01/2019  4. Unwanted fertility -BTL papers signed  5. History of pulmonary embolus during pregnancy -on Lovenox daily  6. Genetic carrier -increased risk SMA -GC 07/01/2019  7. Obesity, morbid, BMI 50 or higher (HCC)  Term labor symptoms and general obstetric precautions including but not limited to vaginal bleeding, contractions, leaking of fluid and fetal movement were reviewed in detail with the patient.  Return for C/S 10/14/2019.  No future appointments.   Time spent on virtual visit: 5 minutes  Marylen Ponto, NP

## 2019-10-12 NOTE — Progress Notes (Signed)
I connected with  Sophia Olson on 10/12/19 at  1:35 PM EDT by telephone and verified that I am speaking with the correct person using two identifiers.   I discussed the limitations, risks, security and privacy concerns of performing an evaluation and management service by telephone and the availability of in person appointments. I also discussed with the patient that there may be a patient responsible charge related to this service. The patient expressed understanding and agreed to proceed.  [name], CMA 10/12/2019  1:25 PM   Will take BP Later and send a mychart message with data.

## 2019-10-13 LAB — RPR: RPR Ser Ql: NONREACTIVE

## 2019-10-14 ENCOUNTER — Other Ambulatory Visit: Payer: Self-pay

## 2019-10-14 ENCOUNTER — Inpatient Hospital Stay (HOSPITAL_COMMUNITY): Payer: Medicaid Other | Admitting: Anesthesiology

## 2019-10-14 ENCOUNTER — Encounter (HOSPITAL_COMMUNITY): Payer: Self-pay | Admitting: Obstetrics and Gynecology

## 2019-10-14 ENCOUNTER — Inpatient Hospital Stay (HOSPITAL_COMMUNITY)
Admission: RE | Admit: 2019-10-14 | Discharge: 2019-10-16 | DRG: 784 | Disposition: A | Discharge status: Home / Self Care | Payer: Medicaid Other | Attending: Obstetrics and Gynecology | Admitting: Obstetrics and Gynecology

## 2019-10-14 ENCOUNTER — Encounter (HOSPITAL_COMMUNITY)
Admission: RE | Disposition: A | Discharge status: Home / Self Care | Payer: Self-pay | Source: Home / Self Care | Attending: Obstetrics and Gynecology

## 2019-10-14 DIAGNOSIS — O321XX0 Maternal care for breech presentation, not applicable or unspecified: Secondary | ICD-10-CM | POA: Diagnosis not present

## 2019-10-14 DIAGNOSIS — O34211 Maternal care for low transverse scar from previous cesarean delivery: Principal | ICD-10-CM | POA: Diagnosis present

## 2019-10-14 DIAGNOSIS — Z302 Encounter for sterilization: Secondary | ICD-10-CM

## 2019-10-14 DIAGNOSIS — O99214 Obesity complicating childbirth: Secondary | ICD-10-CM | POA: Diagnosis present

## 2019-10-14 DIAGNOSIS — Z3009 Encounter for other general counseling and advice on contraception: Secondary | ICD-10-CM | POA: Diagnosis present

## 2019-10-14 DIAGNOSIS — Z86711 Personal history of pulmonary embolism: Secondary | ICD-10-CM | POA: Diagnosis not present

## 2019-10-14 DIAGNOSIS — Z20822 Contact with and (suspected) exposure to covid-19: Secondary | ICD-10-CM | POA: Diagnosis present

## 2019-10-14 DIAGNOSIS — O9081 Anemia of the puerperium: Secondary | ICD-10-CM | POA: Diagnosis not present

## 2019-10-14 DIAGNOSIS — D563 Thalassemia minor: Secondary | ICD-10-CM | POA: Diagnosis present

## 2019-10-14 DIAGNOSIS — Z3A39 39 weeks gestation of pregnancy: Secondary | ICD-10-CM

## 2019-10-14 DIAGNOSIS — O09899 Supervision of other high risk pregnancies, unspecified trimester: Secondary | ICD-10-CM

## 2019-10-14 DIAGNOSIS — D62 Acute posthemorrhagic anemia: Secondary | ICD-10-CM | POA: Diagnosis not present

## 2019-10-14 DIAGNOSIS — Z148 Genetic carrier of other disease: Secondary | ICD-10-CM

## 2019-10-14 DIAGNOSIS — Z98891 History of uterine scar from previous surgery: Secondary | ICD-10-CM

## 2019-10-14 DIAGNOSIS — O34219 Maternal care for unspecified type scar from previous cesarean delivery: Secondary | ICD-10-CM | POA: Diagnosis present

## 2019-10-14 DIAGNOSIS — Z87898 Personal history of other specified conditions: Secondary | ICD-10-CM

## 2019-10-14 LAB — CBC
HCT: 33 % — ABNORMAL LOW (ref 36.0–46.0)
HCT: 32.1 % — ABNORMAL LOW (ref 36.0–46.0)
Hemoglobin: 9.8 g/dL — ABNORMAL LOW (ref 12.0–15.0)
Hemoglobin: 9.8 g/dL — ABNORMAL LOW (ref 12.0–15.0)
MCH: 23.1 pg — ABNORMAL LOW (ref 26.0–34.0)
MCH: 23.2 pg — ABNORMAL LOW (ref 26.0–34.0)
MCHC: 29.7 g/dL — ABNORMAL LOW (ref 30.0–36.0)
MCHC: 30.5 g/dL (ref 30.0–36.0)
MCV: 77.8 fL — ABNORMAL LOW (ref 80.0–100.0)
MCV: 75.9 fL — ABNORMAL LOW (ref 80.0–100.0)
Platelets: 241 10*3/uL (ref 150–400)
Platelets: 249 10*3/uL (ref 150–400)
RBC: 4.24 MIL/uL (ref 3.87–5.11)
RBC: 4.23 MIL/uL (ref 3.87–5.11)
RDW: 15.1 % (ref 11.5–15.5)
RDW: 15 % (ref 11.5–15.5)
WBC: 11 10*3/uL — ABNORMAL HIGH (ref 4.0–10.5)
WBC: 13.8 10*3/uL — ABNORMAL HIGH (ref 4.0–10.5)
nRBC: 0 % (ref 0.0–0.2)
nRBC: 0 % (ref 0.0–0.2)

## 2019-10-14 LAB — DIC (DISSEMINATED INTRAVASCULAR COAGULATION)PANEL
D-Dimer, Quant: 0.97 ug/mL-FEU — ABNORMAL HIGH (ref 0.00–0.50)
Fibrinogen: 544 mg/dL — ABNORMAL HIGH (ref 210–475)
INR: 1.1 (ref 0.8–1.2)
Platelets: 250 10*3/uL (ref 150–400)
Prothrombin Time: 13.6 seconds (ref 11.4–15.2)
Smear Review: NONE SEEN
aPTT: 26 seconds (ref 24–36)

## 2019-10-14 LAB — POSTPARTUM HEMORRHAGE PROTOCOL (BB NOTIFICATION)

## 2019-10-14 SURGERY — Surgical Case
Anesthesia: Spinal | Site: Abdomen | Laterality: Bilateral | Wound class: Clean Contaminated

## 2019-10-14 MED ORDER — DIPHENHYDRAMINE HCL 25 MG PO CAPS
25.0000 mg | ORAL_CAPSULE | Freq: Four times a day (QID) | ORAL | Status: DC | PRN
  Administered 2019-10-15: 25 mg via ORAL
  Filled 2019-10-14: qty 1

## 2019-10-14 MED ORDER — FENTANYL CITRATE (PF) 100 MCG/2ML IJ SOLN
INTRAMUSCULAR | Status: DC | PRN
  Administered 2019-10-14: 15 ug via INTRATHECAL

## 2019-10-14 MED ORDER — OXYTOCIN-SODIUM CHLORIDE 30-0.9 UT/500ML-% IV SOLN: 2.5000 [IU]/h | INTRAVENOUS | Status: AC

## 2019-10-14 MED ORDER — ONDANSETRON HCL 4 MG/2ML IJ SOLN
INTRAMUSCULAR | Status: AC
  Filled 2019-10-14: qty 2

## 2019-10-14 MED ORDER — SODIUM CHLORIDE 0.9 % IV SOLN: INTRAVENOUS | Status: DC | PRN

## 2019-10-14 MED ORDER — DIPHENHYDRAMINE HCL 25 MG PO CAPS: 25.0000 mg | ORAL_CAPSULE | ORAL | Status: DC | PRN

## 2019-10-14 MED ORDER — LIDOCAINE-EPINEPHRINE (PF) 2 %-1:200000 IJ SOLN
INTRAMUSCULAR | Status: AC
  Filled 2019-10-14: qty 10

## 2019-10-14 MED ORDER — KETOROLAC TROMETHAMINE 30 MG/ML IJ SOLN: 30.0000 mg | Freq: Four times a day (QID) | INTRAMUSCULAR | Status: AC | PRN

## 2019-10-14 MED ORDER — ENOXAPARIN SODIUM 80 MG/0.8ML ~~LOC~~ SOLN: 80.0000 mg | SUBCUTANEOUS | Status: DC

## 2019-10-14 MED ORDER — ENOXAPARIN SODIUM 80 MG/0.8ML ~~LOC~~ SOLN
80.0000 mg | SUBCUTANEOUS | Status: DC
  Administered 2019-10-16: 80 mg via SUBCUTANEOUS
  Filled 2019-10-14: qty 0.8

## 2019-10-14 MED ORDER — SIMETHICONE 80 MG PO CHEW: 80.0000 mg | CHEWABLE_TABLET | ORAL | Status: DC | PRN

## 2019-10-14 MED ORDER — FENTANYL CITRATE (PF) 100 MCG/2ML IJ SOLN
INTRAMUSCULAR | Status: AC
  Filled 2019-10-14: qty 2

## 2019-10-14 MED ORDER — DEXAMETHASONE SODIUM PHOSPHATE 4 MG/ML IJ SOLN
INTRAMUSCULAR | Status: DC | PRN
  Administered 2019-10-14: 10 mg via INTRAVENOUS

## 2019-10-14 MED ORDER — COCONUT OIL OIL: 1.0000 "application " | TOPICAL_OIL | Status: DC | PRN

## 2019-10-14 MED ORDER — TRANEXAMIC ACID-NACL 1000-0.7 MG/100ML-% IV SOLN
INTRAVENOUS | Status: AC
  Filled 2019-10-14: qty 100

## 2019-10-14 MED ORDER — IBUPROFEN 800 MG PO TABS
800.0000 mg | ORAL_TABLET | Freq: Four times a day (QID) | ORAL | Status: DC
  Administered 2019-10-15 – 2019-10-16 (×5): 800 mg via ORAL
  Filled 2019-10-14 (×5): qty 1

## 2019-10-14 MED ORDER — DEXTROSE 5 % IV SOLN
3.0000 g | INTRAVENOUS | Status: AC
  Administered 2019-10-14: 3 g via INTRAVENOUS

## 2019-10-14 MED ORDER — SIMETHICONE 80 MG PO CHEW
80.0000 mg | CHEWABLE_TABLET | ORAL | Status: DC
  Administered 2019-10-14 – 2019-10-15 (×2): 80 mg via ORAL
  Filled 2019-10-14 (×2): qty 1

## 2019-10-14 MED ORDER — NALBUPHINE HCL 10 MG/ML IJ SOLN: 5.0000 mg | Freq: Once | INTRAMUSCULAR | Status: AC | PRN

## 2019-10-14 MED ORDER — NALBUPHINE HCL 10 MG/ML IJ SOLN: 5.0000 mg | INTRAMUSCULAR | Status: DC | PRN

## 2019-10-14 MED ORDER — ACETAMINOPHEN 500 MG PO TABS: 1000.0000 mg | ORAL_TABLET | Freq: Once | ORAL | Status: DC

## 2019-10-14 MED ORDER — MORPHINE SULFATE (PF) 0.5 MG/ML IJ SOLN
INTRAMUSCULAR | Status: DC | PRN
  Administered 2019-10-14: .15 mg via INTRATHECAL

## 2019-10-14 MED ORDER — KETOROLAC TROMETHAMINE 30 MG/ML IJ SOLN
30.0000 mg | Freq: Four times a day (QID) | INTRAMUSCULAR | Status: AC
  Administered 2019-10-14 – 2019-10-15 (×3): 30 mg via INTRAVENOUS
  Filled 2019-10-14 (×3): qty 1

## 2019-10-14 MED ORDER — PHENYLEPHRINE HCL-NACL 20-0.9 MG/250ML-% IV SOLN
INTRAVENOUS | Status: AC
  Filled 2019-10-14: qty 250

## 2019-10-14 MED ORDER — OXYTOCIN-SODIUM CHLORIDE 30-0.9 UT/500ML-% IV SOLN
INTRAVENOUS | Status: AC
  Filled 2019-10-14: qty 500

## 2019-10-14 MED ORDER — SODIUM CHLORIDE 0.9% FLUSH: 3.0000 mL | INTRAVENOUS | Status: DC | PRN

## 2019-10-14 MED ORDER — FENTANYL CITRATE (PF) 100 MCG/2ML IJ SOLN
25.0000 ug | INTRAMUSCULAR | Status: DC | PRN
  Administered 2019-10-14: 50 ug via INTRAVENOUS

## 2019-10-14 MED ORDER — ACETAMINOPHEN 160 MG/5ML PO SOLN: 1000.0000 mg | Freq: Once | ORAL | Status: DC

## 2019-10-14 MED ORDER — LACTATED RINGERS IV SOLN: INTRAVENOUS | Status: DC | PRN

## 2019-10-14 MED ORDER — DIPHENHYDRAMINE HCL 50 MG/ML IJ SOLN: 12.5000 mg | INTRAMUSCULAR | Status: DC | PRN

## 2019-10-14 MED ORDER — PRENATAL MULTIVITAMIN CH
1.0000 | ORAL_TABLET | Freq: Every day | ORAL | Status: DC
  Administered 2019-10-15 – 2019-10-16 (×2): 1 via ORAL
  Filled 2019-10-14 (×2): qty 1

## 2019-10-14 MED ORDER — DEXAMETHASONE SODIUM PHOSPHATE 10 MG/ML IJ SOLN
INTRAMUSCULAR | Status: AC
  Filled 2019-10-14: qty 1

## 2019-10-14 MED ORDER — TETANUS-DIPHTH-ACELL PERTUSSIS 5-2.5-18.5 LF-MCG/0.5 IM SUSP: 0.5000 mL | Freq: Once | INTRAMUSCULAR | Status: DC

## 2019-10-14 MED ORDER — OXYCODONE HCL 5 MG PO TABS
5.0000 mg | ORAL_TABLET | ORAL | Status: DC | PRN
  Administered 2019-10-15 – 2019-10-16 (×4): 10 mg via ORAL
  Filled 2019-10-14 (×4): qty 2

## 2019-10-14 MED ORDER — MORPHINE SULFATE (PF) 0.5 MG/ML IJ SOLN
INTRAMUSCULAR | Status: AC
  Filled 2019-10-14: qty 10

## 2019-10-14 MED ORDER — DIBUCAINE (PERIANAL) 1 % EX OINT: 1.0000 "application " | TOPICAL_OINTMENT | CUTANEOUS | Status: DC | PRN

## 2019-10-14 MED ORDER — MISOPROSTOL 200 MCG PO TABS
ORAL_TABLET | ORAL | Status: AC
  Filled 2019-10-14: qty 5

## 2019-10-14 MED ORDER — MISOPROSTOL 200 MCG PO TABS
1000.0000 ug | ORAL_TABLET | Freq: Once | ORAL | Status: AC
  Administered 2019-10-14: 1000 ug via RECTAL

## 2019-10-14 MED ORDER — PROMETHAZINE HCL 25 MG/ML IJ SOLN
INTRAMUSCULAR | Status: AC
  Filled 2019-10-14: qty 1

## 2019-10-14 MED ORDER — LACTATED RINGERS IV BOLUS: 1000.0000 mL | Freq: Once | INTRAVENOUS | Status: DC

## 2019-10-14 MED ORDER — LIDOCAINE 2% (20 MG/ML) 5 ML SYRINGE
INTRAMUSCULAR | Status: AC
  Filled 2019-10-14: qty 5

## 2019-10-14 MED ORDER — NALBUPHINE HCL 10 MG/ML IJ SOLN
INTRAMUSCULAR | Status: AC
  Filled 2019-10-14: qty 1

## 2019-10-14 MED ORDER — NALOXONE HCL 4 MG/10ML IJ SOLN
1.0000 ug/kg/h | INTRAVENOUS | Status: DC | PRN
  Filled 2019-10-14: qty 5

## 2019-10-14 MED ORDER — ACETAMINOPHEN 325 MG PO TABS: 650.0000 mg | ORAL_TABLET | Freq: Four times a day (QID) | ORAL | Status: DC | PRN

## 2019-10-14 MED ORDER — PHENYLEPHRINE HCL-NACL 20-0.9 MG/250ML-% IV SOLN
INTRAVENOUS | Status: DC | PRN
  Administered 2019-10-14: 60 ug/min via INTRAVENOUS

## 2019-10-14 MED ORDER — NALOXONE HCL 0.4 MG/ML IJ SOLN: 0.4000 mg | INTRAMUSCULAR | Status: DC | PRN

## 2019-10-14 MED ORDER — WITCH HAZEL-GLYCERIN EX PADS: 1.0000 "application " | MEDICATED_PAD | CUTANEOUS | Status: DC | PRN

## 2019-10-14 MED ORDER — PROMETHAZINE HCL 25 MG/ML IJ SOLN
6.2500 mg | INTRAMUSCULAR | Status: DC | PRN
  Administered 2019-10-14: 12.5 mg via INTRAVENOUS

## 2019-10-14 MED ORDER — OXYTOCIN-SODIUM CHLORIDE 30-0.9 UT/500ML-% IV SOLN
INTRAVENOUS | Status: DC | PRN
  Administered 2019-10-14: 30 [IU] via INTRAVENOUS

## 2019-10-14 MED ORDER — KETOROLAC TROMETHAMINE 30 MG/ML IJ SOLN
30.0000 mg | Freq: Four times a day (QID) | INTRAMUSCULAR | Status: AC | PRN
  Administered 2019-10-14: 30 mg via INTRAMUSCULAR

## 2019-10-14 MED ORDER — LACTATED RINGERS IV SOLN: INTRAVENOUS | Status: DC

## 2019-10-14 MED ORDER — SENNOSIDES-DOCUSATE SODIUM 8.6-50 MG PO TABS
2.0000 | ORAL_TABLET | ORAL | Status: DC
  Administered 2019-10-14 – 2019-10-15 (×2): 2 via ORAL
  Filled 2019-10-14 (×2): qty 2

## 2019-10-14 MED ORDER — ACETAMINOPHEN 500 MG PO TABS
1000.0000 mg | ORAL_TABLET | Freq: Four times a day (QID) | ORAL | Status: AC
  Administered 2019-10-14 – 2019-10-15 (×2): 1000 mg via ORAL
  Filled 2019-10-14 (×2): qty 2

## 2019-10-14 MED ORDER — KETOROLAC TROMETHAMINE 30 MG/ML IJ SOLN
INTRAMUSCULAR | Status: AC
  Filled 2019-10-14: qty 1

## 2019-10-14 MED ORDER — TRANEXAMIC ACID-NACL 1000-0.7 MG/100ML-% IV SOLN
1000.0000 mg | INTRAVENOUS | Status: AC
  Administered 2019-10-14: 1000 mg via INTRAVENOUS

## 2019-10-14 MED ORDER — BUPIVACAINE IN DEXTROSE 0.75-8.25 % IT SOLN
INTRATHECAL | Status: AC
  Filled 2019-10-14: qty 2

## 2019-10-14 MED ORDER — ONDANSETRON HCL 4 MG/2ML IJ SOLN
4.0000 mg | Freq: Three times a day (TID) | INTRAMUSCULAR | Status: DC | PRN
  Administered 2019-10-14: 4 mg via INTRAVENOUS

## 2019-10-14 MED ORDER — METHYLERGONOVINE MALEATE 0.2 MG/ML IJ SOLN
0.2000 mg | Freq: Once | INTRAMUSCULAR | Status: AC
  Administered 2019-10-14: 0.2 mg via INTRAMUSCULAR

## 2019-10-14 MED ORDER — SIMETHICONE 80 MG PO CHEW
80.0000 mg | CHEWABLE_TABLET | Freq: Three times a day (TID) | ORAL | Status: DC
  Administered 2019-10-15 – 2019-10-16 (×4): 80 mg via ORAL
  Filled 2019-10-14 (×4): qty 1

## 2019-10-14 MED ORDER — ZOLPIDEM TARTRATE 5 MG PO TABS: 5.0000 mg | ORAL_TABLET | Freq: Every evening | ORAL | Status: DC | PRN

## 2019-10-14 MED ORDER — KETOROLAC TROMETHAMINE 30 MG/ML IJ SOLN: 30.0000 mg | Freq: Once | INTRAMUSCULAR | Status: DC

## 2019-10-14 MED ORDER — METHYLERGONOVINE MALEATE 0.2 MG/ML IJ SOLN
0.2000 mg | INTRAMUSCULAR | Status: AC
  Administered 2019-10-14 – 2019-10-15 (×5): 0.2 mg via INTRAMUSCULAR
  Filled 2019-10-14 (×5): qty 1

## 2019-10-14 MED ORDER — DEXTROSE 5 % IV SOLN
INTRAVENOUS | Status: AC
  Filled 2019-10-14: qty 3000

## 2019-10-14 MED ORDER — NALBUPHINE HCL 10 MG/ML IJ SOLN
5.0000 mg | Freq: Once | INTRAMUSCULAR | Status: AC | PRN
  Administered 2019-10-14: 5 mg via INTRAVENOUS

## 2019-10-14 MED ORDER — BUPIVACAINE IN DEXTROSE 0.75-8.25 % IT SOLN
INTRATHECAL | Status: DC | PRN
  Administered 2019-10-14: 1.7 mL via INTRATHECAL

## 2019-10-14 MED ORDER — MENTHOL 3 MG MT LOZG: 1.0000 | LOZENGE | OROMUCOSAL | Status: DC | PRN

## 2019-10-14 MED ORDER — METHYLERGONOVINE MALEATE 0.2 MG/ML IJ SOLN
INTRAMUSCULAR | Status: AC
  Filled 2019-10-14: qty 1

## 2019-10-14 MED ORDER — METHYLERGONOVINE MALEATE 0.2 MG/ML IJ SOLN: 0.2000 mg | INTRAMUSCULAR | Status: DC | PRN

## 2019-10-14 SURGICAL SUPPLY — 30 items
CLAMP CORD UMBIL (MISCELLANEOUS) IMPLANT
DRSG OPSITE POSTOP 4X10 (GAUZE/BANDAGES/DRESSINGS) ×3 IMPLANT
ELECT REM PT RETURN 9FT ADLT (ELECTROSURGICAL) ×3
ELECTRODE REM PT RTRN 9FT ADLT (ELECTROSURGICAL) ×1 IMPLANT
EXTRACTOR VACUUM M CUP 4 TUBE (SUCTIONS) IMPLANT
EXTRACTOR VACUUM M CUP 4' TUBE (SUCTIONS)
GLOVE BIOGEL PI IND STRL 6.5 (GLOVE) ×1 IMPLANT
GLOVE BIOGEL PI IND STRL 7.0 (GLOVE) ×1 IMPLANT
GLOVE BIOGEL PI INDICATOR 6.5 (GLOVE) ×2
GLOVE BIOGEL PI INDICATOR 7.0 (GLOVE) ×2
GLOVE SURG SS PI 6.0 STRL IVOR (GLOVE) ×3 IMPLANT
GOWN STRL REUS W/TWL LRG LVL3 (GOWN DISPOSABLE) ×6 IMPLANT
KIT ABG SYR 3ML LUER SLIP (SYRINGE) IMPLANT
NEEDLE HYPO 25X5/8 SAFETYGLIDE (NEEDLE) IMPLANT
NS IRRIG 1000ML POUR BTL (IV SOLUTION) ×3 IMPLANT
PACK C SECTION WH (CUSTOM PROCEDURE TRAY) ×3 IMPLANT
PAD OB MATERNITY 4.3X12.25 (PERSONAL CARE ITEMS) ×3 IMPLANT
PENCIL SMOKE EVAC W/HOLSTER (ELECTROSURGICAL) ×3 IMPLANT
PREVENA RESTOR AXIOFORM 29X28 (GAUZE/BANDAGES/DRESSINGS) ×3 IMPLANT
RTRCTR C-SECT PINK 25CM LRG (MISCELLANEOUS) IMPLANT
SEPRAFILM MEMBRANE 5X6 (MISCELLANEOUS) IMPLANT
SUT PLAIN 0 NONE (SUTURE) ×3 IMPLANT
SUT PLAIN 2 0 XLH (SUTURE) ×3 IMPLANT
SUT VIC AB 0 CT1 36 (SUTURE) ×12 IMPLANT
SUT VIC AB 3-0 SH 27 (SUTURE) ×4
SUT VIC AB 3-0 SH 27X BRD (SUTURE) ×2 IMPLANT
SUT VIC AB 4-0 KS 27 (SUTURE) ×3 IMPLANT
TOWEL OR 17X24 6PK STRL BLUE (TOWEL DISPOSABLE) ×3 IMPLANT
TRAY FOLEY W/BAG SLVR 14FR LF (SET/KITS/TRAYS/PACK) ×3 IMPLANT
WATER STERILE IRR 1000ML POUR (IV SOLUTION) ×3 IMPLANT

## 2019-10-14 NOTE — Anesthesia Procedure Notes (Signed)
Epidural Patient location during procedure: OR Start time: 10/14/2019 10:28 AM End time: 10/14/2019 10:33 AM  Staffing Anesthesiologist: Kaylyn Layer, MD Performed: anesthesiologist   Preanesthetic Checklist Completed: patient identified, IV checked, risks and benefits discussed, monitors and equipment checked, pre-op evaluation and timeout performed  Epidural Patient position: sitting Prep: DuraPrep and site prepped and draped Patient monitoring: heart rate, continuous pulse ox and blood pressure Approach: midline Location: L3-L4 Injection technique: LOR air  Needle:  Needle type: Tuohy  Needle gauge: 17 G Needle length: 9 cm Needle insertion depth: 9 cm Catheter type: closed end flexible Catheter size: 19 Gauge Catheter at skin depth: 15 cm  Assessment Sensory level: T4 Events: blood not aspirated, injection not painful, no injection resistance, no paresthesia and negative IV test  Additional Notes Patient identified. Risks, benefits, and alternatives discussed with patient including but not limited to bleeding, infection, nerve damage, paralysis, failed block, incomplete pain control, headache, blood pressure changes, nausea, vomiting, reactions to medication, itching, and postpartum back pain. Confirmed the patient's most recent platelet count. Confirmed with patient that they are not currently taking any anticoagulation, have any bleeding history, or any family history of bleeding disorders. Patient expressed understanding and wished to proceed. All questions were answered. Sterile technique was used throughout the entire procedure. Please see notes for vital signs.   Crisp LOR at 9cm firmly pressed into subcutaneous tissue with Tuohy needle after one needle redirection. Whitacre 25g spinal needle introduced through Tuohy with clear CSF return prior to injection of intrathecal medication. Spinal needle withdrawn and epidural catheter threaded easily. Negative  aspiration of catheter for heme or CSF.  Reason for block:surgical anesthesia

## 2019-10-14 NOTE — Op Note (Signed)
Sophia Olson PROCEDURE DATE: 10/14/2019  PREOPERATIVE DIAGNOSIS: Intrauterine pregnancy at  [redacted]w[redacted]d weeks gestation; previous uterine incision kerr x3 or greater and desire for permanent sterilization  POSTOPERATIVE DIAGNOSIS: The same  PROCEDURE:     Cesarean Section and bilateral salpingectomy  SURGEON:  Dr. Mora Bellman  ASSISTANT: Dr. Barrington Ellison  INDICATIONS: Sophia Olson is a 29 y.o. [phone] at [redacted]w[redacted]d scheduled for cesarean section secondary to previous uterine incision kerr x3 or greater and desire for sterilization  The risks of cesarean section discussed with the patient included but were not limited to: bleeding which may require transfusion or reoperation; infection which may require antibiotics; injury to bowel, bladder, ureters or other surrounding organs; injury to the fetus; need for additional procedures including hysterectomy in the event of a life-threatening hemorrhage; placental abnormalities wth subsequent pregnancies, incisional problems, thromboembolic phenomenon and other postoperative/anesthesia complications. Patient also desires permanent sterilization. Risks and benefits of procedure discussed with patient including permanence of method, bleeding, infection, injury to surrounding organs and need for additional procedures. Risk failure of 0.5-1% with increased risk of ectopic gestation if pregnancy occurs was also discussed with patient. The patient concurred with the proposed plan, giving informed written consent for the procedure.    FINDINGS:  Viable female infant in breech presentation.  Apgars 9 and 9.  Clear amniotic fluid.  Intact placenta, three vessel cord.  Normal uterus, fallopian tubes and ovaries bilaterally.  ANESTHESIA:    Spinal INTRAVENOUS FLUIDS:2300 ml ESTIMATED BLOOD LOSS: 265 ml URINE OUTPUT:  50 ml SPECIMENS: Placenta sent to L&D COMPLICATIONS: None immediate  PROCEDURE IN DETAIL:  The patient received intravenous antibiotics and had  sequential compression devices applied to her lower extremities while in the preoperative area.  She was then taken to the operating room where anesthesia was induced and was found to be adequate. A foley catheter was placed into her bladder and attached to Sophia Olson gravity. She was then placed in a dorsal supine position with a leftward tilt, and prepped and draped in a sterile manner. After an adequate timeout was performed, a Pfannenstiel skin incision was made with scalpel and carried through to the underlying layer of fascia. The fascia was incised in the midline and this incision was extended bilaterally using the Mayo scissors. Kocher clamps were applied to the superior aspect of the fascial incision and the underlying rectus muscles were dissected off bluntly. A similar process was carried out on the inferior aspect of the facial incision. The rectus muscles were separated in the midline bluntly and the peritoneum was entered bluntly. The Alexis self-retaining retractor was introduced into the abdominal cavity. Attention was turned to the lower uterine segment where a transverse hysterotomy was made with a scalpel and extended bilaterally bluntly. The infant was successfully delivered and delayed cord clamping was performed for 1 minute. The cord was clamped and cut and infant was handed over to awaiting neonatology team. Uterine massage was then administered and the placenta delivered intact with three-vessel cord. The uterus was cleared of clot and debris.  The hysterotomy was closed with 0 Vicryl in a running locked fashion, and an imbricating layer was also placed with a 0 Vicryl. Overall, excellent hemostasis was noted. The pelvis copiously irrigated and cleared of all clot and debris. The patient's left fallopian tube was then identified, brought to the incision, and grasped with a Babcock clamp. The tube was then followed out to the fimbria. A Kelly clamp was then used to grasp the tube and transect  it. The tube was then ligated with free 0 Vicryl x 2. Good hemostasis was noted and the tube was returned to the abdomen. The right fallopian tube was then identified to its fimbriated end, clamped using a Kelly clamp, transected and suture ligated in a similar fashion. Excellent hemostasis was noted, and the tube returned to the abdomen.  Hemostasis was confirmed on all surfaces.  The peritoneum and the muscles were reapproximated using 0 vicryl interrupted stitches. The fascia was then closed using 0 Vicryl in a running fashion.  The subcutaneous layer was reapproximated with plain gut and the skin was closed in a subcuticular fashion using 3.0 Vicryl. Prevena wound vac was applied over the incision. The patient tolerated the procedure well. Sponge, lap, instrument and needle counts were correct x 2. She was taken to the recovery room in stable condition.    Sophia Olson ConstantMD  10/14/2019 11:18 AM  The fascia was re-approximated with 0 Vicryl. The skin was closed in a subcuticular fashion with 3-0 Vicryl. Quarter percent Marcaine solution was then injected at the incision site. The patient tolerated the procedure well. Sponge, lap, and needle count were correct x2. The patient was taken to recovery room in stable condition.

## 2019-10-14 NOTE — Transfer of Care (Signed)
Immediate Anesthesia Transfer of Care Note  Patient: Sophia Olson  Procedure(s) Performed: CESAREAN SECTION WITH BILATERAL TUBAL LIGATION (Bilateral Abdomen)  Patient Location: PACU  Anesthesia Type:Spinal and Epidural  Level of Consciousness: awake  Airway & Oxygen Therapy: Patient Spontanous Breathing  Post-op Assessment: Report given to RN and Post -op Vital signs reviewed and stable  Post vital signs: Reviewed and stable  Last Vitals:  Vitals Value Taken Time  BP 90/56 10/14/19 1135  Temp    Pulse 85 10/14/19 1139  Resp 17 10/14/19 1139  SpO2 95 % 10/14/19 1139  Vitals shown include unvalidated device data.  Last Pain:  Vitals:   10/14/19 1136  TempSrc: (P) Oral         Complications: No complications documented.

## 2019-10-14 NOTE — Discharge Summary (Signed)
Postpartum Discharge Summary     Patient Name: Sophia Olson DOB: 1990-07-23 MRN: [id number]  Date of admission: 10/14/2019 Delivery date:10/14/2019  Delivering provider: CONSTANT, [place]  Date of discharge: 10/16/2019  Admitting diagnosis: Previous cesarean section complicating pregnancy, antepartum condition or complication [V76.160] Intrauterine pregnancy: [redacted]w[redacted]d    Secondary diagnosis:  Active Problems:   History of pulmonary embolus during pregnancy   History of preterm delivery, currently pregnant   History of low birth weight   History of 3 cesarean sections   Obesity, morbid, BMI 50 or higher ([facility]alo   Unwanted fertility   Alpha thalassemia silent carrier   Genetic carrier   Previous cesarean section complicating pregnancy, antepartum condition or complication   PPH (postpartum hemorrhage)   Postpartum anemia  Additional problems: None    Discharge diagnosis: Term Pregnancy Delivered, Anemia and PPH                                              Post partum procedures: PPH with Bakri balloon placement. Feraheme infusion. Augmentation: N/A Complications: None  Hospital course: Sceduled C/S   29y.o. yo [id number] admitted to the hospital 10/14/2019 for scheduled cesarean section with the following indication:Elective Repeat and malpresentation.Delivery details are as follows:  Membrane Rupture Time/Date: 10:54 AM ,10/14/2019   Delivery Method:C-Section, Low Transverse  Details of operation can be found in separate operative note. Bilateral salpingectomy done intra-operatively. Patient with PPH in PACU and Bakri balloon placed. Repeat hemoglobin 7.6, patient was asymptomatic and she received Feraheme. She has a hx of PE and is to cont Lovenox for 6 weeks PP. Patient had an uncomplicated postpartum course.  She is ambulating, tolerating a regular diet, passing flatus, and urinating well. Patient is discharged home in stable condition on  10/16/19        Newborn  Data: Birth date:10/14/2019  Birth time:10:55 AM  Gender:Female  Living status:Living  Apgars:8 ,9  Weight:4460 g     Magnesium Sulfate received: No BMZ received: No Rhophylac:N/A MMR:N/A T-DaP:Given prenatally Flu: No Transfusion:No  Physical exam  Vitals:   10/15/19 1310 10/15/19 1550 10/15/19 2027 10/15/19 2315  BP: 126/66 123/78 120/68 130/87  Pulse: (!) 106 90 88 87  Resp:  18 18 17   Temp:  98.1 F (36.7 C) 97.9 F (36.6 C) 98 F (36.7 C)  TempSrc:  Oral Oral Oral  SpO2:  99% 98% 99%  Weight:      Height:       General: alert, cooperative and no distress Lochia: appropriate Uterine Fundus: firm Incision: Dressing is clean, dry, and intact, Prevena in place DVT Evaluation: No evidence of DVT seen on physical exam. Negative Homan's sign. No cords or calf tenderness. No significant calf/ankle edema. Labs: CBC Latest Ref Rng & Units 10/16/2019 10/15/2019 10/14/2019  WBC 4.0 - 10.5 K/uL 7.2 13.5(H) 13.8(H)  Hemoglobin 12.0 - 15.0 g/dL 7.6(L) 8.5(L) 9.8(L)  Hematocrit 36 - 46 % 24.5(L) 27.6(L) 32.1(L)  Platelets 150 - 400 K/uL 221 235 249    CMP Latest Ref Rng & Units 04/17/2016  Glucose 65 - 99 mg/dL 90  BUN 6 - 20 mg/dL 14  Creatinine 0.44 - 1.00 mg/dL 0.84  Sodium 135 - 145 mmol/L 140  Potassium 3.5 - 5.1 mmol/L 3.7  Chloride 101 - 111 mmol/L 111  CO2 22 - 32 mmol/L 23  Calcium 8.9 - 10.3 mg/dL 8.9  Total Protein 6.5 - 8.1 g/dL 7.8  Total Bilirubin 0.3 - 1.2 mg/dL 0.4  Alkaline Phos 38 - 126 U/L 93  AST 15 - 41 U/L 17  ALT 14 - 54 U/L 21   Edinburgh Score: No flowsheet data found.   After visit meds:  Allergies as of 10/16/2019   No Known Allergies     Medication List    TAKE these medications   Blood Pressure Monitoring Devi 1 Device by Does not apply route once a week.   enoxaparin 80 MG/0.8ML injection Commonly known as: LOVENOX Inject 0.8 mLs (80 mg total) into the skin daily. Start taking on: October 17, 2019 What changed:   medication  strength  how much to take   ibuprofen 800 MG tablet Commonly known as: ADVIL Take 1 tablet (800 mg total) by mouth 3 (three) times daily with meals as needed for moderate pain or cramping.   iron polysaccharides 150 MG capsule Commonly known as: NIFEREX Take 1 capsule (150 mg total) by mouth daily.   oxyCODONE 5 MG immediate release tablet Commonly known as: Oxy IR/ROXICODONE Take 1 tablet (5 mg total) by mouth every 6 (six) hours as needed for moderate pain or severe pain.   prenatal multivitamin Tabs tablet Take 1 tablet by mouth daily at 12 noon.   senna-docusate 8.6-50 MG tablet Commonly known as: Senokot-S Take 2 tablets by mouth at bedtime as needed for mild constipation or moderate constipation.            Discharge Care Instructions  (From admission, onward)         Start     Ordered   10/16/19 0000  Discharge wound care:       Comments: As per discharge handout and nursing instructions   10/16/19 0639           Discharge home in stable condition Infant Feeding: Bottle Infant Disposition:home with mother Discharge instruction: per After Visit Summary and Postpartum booklet. Activity: Advance as tolerated. Pelvic rest for 6 weeks.  Diet: routine diet Future Appointments: Future Appointments  Date Time Provider [place]  10/21/2019  2:30 PM [place] [facility]  11/16/2019  2:15 PM Jorje Guild, NP [facility]   Follow up Visit:   Please schedule this patient for a Virtual postpartum visit in 4 weeks with the following provider: Any provider. Additional Postpartum F/U:Incision check 1 week and Prevena removal  Low risk pregnancy complicated by: anemia and PE Delivery mode:  C-Section, Low Transverse  Anticipated Birth Control:  bilateral salpingectomy   10/16/2019 Verita Schneiders, MD

## 2019-10-14 NOTE — H&P (Signed)
Obstetric Preoperative History and Physical  Sophia Olson is a 29 y.o. [phone] with IUP at [redacted]w[redacted]d presenting for scheduled cesarean section.  Reports good fetal movement, no bleeding, no contractions, no leaking of fluid.  No acute preoperative concerns.    Cesarean Section Indication: repeat; 3 prior sections  Prenatal Course Source of Care: [facility]   Pregnancy complications or risks: Patient Active Problem List   Diagnosis Date Noted  . Alpha thalassemia silent carrier 05/19/2019  . Genetic carrier 05/19/2019  . Unwanted fertility 04/29/2019  . Supervision of high risk pregnancy, antepartum 04/26/2019  . Obesity, morbid, BMI 50 or higher ([place]) 04/26/2019  . History of pulmonary embolus during pregnancy   . History of preterm delivery, currently pregnant   . History of low birth weight   . History of 3 cesarean sections    She plans to bottle feed She desires bilateral tubal ligation for postpartum contraception.   Prenatal labs and studies: ABO, Rh: --/--/O POS, O POS Performed at [facility], [place] [address] [place], [place] [address]  [phone]) Antibody: NEG (06/22 5400) Rubella: Immune (12/21 0000) RPR: NON REACTIVE (06/22 0921)  HBsAg: Negative (12/21 0000)  HIV: Non-reactive (12/21 0000)  QQP:YPPJKDTO/-- (05/28 1031) 1 hr Glucola  101 Genetic screening normal Anatomy US normal  Prenatal Transfer Tool  Maternal Diabetes: No Genetic Screening: Normal Maternal Ultrasounds/Referrals: Normal Fetal Ultrasounds or other Referrals:  None Maternal Substance Abuse:  No Significant Maternal Medications:  None Significant Maternal Lab Results: Group B Strep negative  Past Medical History:  Diagnosis Date  . Blood transfusion without reported diagnosis   . History of postpartum hemorrhage   . History of preterm delivery, currently pregnant   . History of seizures    as a child; unknown cause  . Preterm labor   . Seizures ([place])   . Thromboembolism during  pregnancy     Past Surgical History:  Procedure Laterality Date  . CESAREAN SECTION     x3  . WISDOM TOOTH EXTRACTION      OB History  Gravida Para Term Preterm AB Living  4 3 2 1   3   SAB TAB Ectopic Multiple Live Births          3    # Outcome Date GA Lbr Len/2nd Weight Sex Delivery Anes PTL Lv  4 Current           3 Term 01/24/19     CS-Unspec   LIV     Birth Comments: postpartum hemorrhage  2 Term 09/24/11     CS-Unspec   LIV  1 Preterm 12/12/07 [redacted]w[redacted]d    CS-Unspec   LIV     Birth Comments: Preterm Labor, Breech , PE- hospitalized for PE: Stat C/S for breech    Social History   Socioeconomic History  . Marital status: Single    Spouse name: Not on file  . Number of children: Not on file  . Years of education: Not on file  . Highest education level: Not on file  Occupational History  . Not on file  Tobacco Use  . Smoking status: Never Smoker  . Smokeless tobacco: Never Used  Vaping Use  . Vaping Use: Never used  Substance and Sexual Activity  . Alcohol use: No  . Drug use: No  . Sexual activity: Yes    Birth control/protection: None  Other Topics Concern  . Not on file  Social History Narrative  . Not on file   Social  Determinants of Health   Financial Resource Strain:   . Difficulty of Paying Living Expenses:   Food Insecurity: No Food Insecurity  . Worried About Programme researcher, broadcasting/film/video in the Last Year: Never true  . Ran Out of Food in the Last Year: Never true  Transportation Needs: No Transportation Needs  . Lack of Transportation (Medical): No  . Lack of Transportation (Non-Medical): No  Physical Activity:   . Days of Exercise per Week:   . Minutes of Exercise per Session:   Stress:   . Feeling of Stress :   Social Connections:   . Frequency of Communication with Friends and Family:   . Frequency of Social Gatherings with Friends and Family:   . Attends Religious Services:   . Active Member of Clubs or Organizations:   . Attends Tax inspector Meetings:   Marland Kitchen Marital Status:     History reviewed. No pertinent family history.  Medications Prior to Admission  Medication Sig Dispense Refill Last Dose  . enoxaparin (LOVENOX) 40 MG/0.4ML injection Inject 0.4 mLs (40 mg total) into the skin daily. 30 mL 2   . Prenatal Vit-Fe Fumarate-FA (PRENATAL MULTIVITAMIN) TABS tablet Take 1 tablet by mouth daily at 12 noon.     . Blood Pressure Monitoring DEVI 1 Device by Does not apply route once a week. 1 Device 0     No Known Allergies  Review of Systems: Pertinent items noted in HPI and remainder of comprehensive ROS otherwise negative.  Physical Exam: BP (!) 142/83   Pulse (!) 107   Resp 18   Ht 5\' 8"  (1.727 m)   Wt (!) 163.3 kg   LMP 01/10/2019 (Approximate)   SpO2 99%   Breastfeeding No Comment: formula  BMI 54.74 kg/m  FHR by Doppler: 140's bpm CONSTITUTIONAL: Well-developed, well-nourished female in no acute distress. Obese. HENT:  Normocephalic, atraumatic, External right and left ear normal. Oropharynx is clear and moist EYES: Conjunctivae and EOM are normal. No scleral icterus.  NECK: Normal range of motion, supple, no masses SKIN: Skin is warm and dry. No rash noted. Not diaphoretic. No erythema. No pallor. NEUROLGIC: Alert and oriented to person, place, and time. Normal reflexes, muscle tone coordination. No cranial nerve deficit noted. PSYCHIATRIC: Normal mood and affect. Normal behavior. Normal judgment and thought content. CARDIOVASCULAR: Normal heart rate noted RESPIRATORY: Effort and breath sounds normal, no problems with respiration noted ABDOMEN: Soft, nontender, nondistended, gravid. Well-healed Pfannenstiel incision. PELVIC: Deferred MUSCULOSKELETAL: Normal range of motion. No edema and no tenderness. 2+ distal pulses.   Pertinent Labs/Studies:   Results for orders placed or performed during the hospital encounter of 10/12/19 (from the past 72 hour(s))  SARS CORONAVIRUS 2 (TAT 6-24 HRS)  Nasopharyngeal Nasopharyngeal Swab     Status: None   Collection Time: 10/12/19  9:12 AM   Specimen: Nasopharyngeal Swab  Result Value Ref Range   SARS Coronavirus 2 NEGATIVE NEGATIVE    Comment: (NOTE) SARS-CoV-2 target nucleic acids are NOT DETECTED.  The SARS-CoV-2 RNA is generally detectable in upper and lower respiratory specimens during the acute phase of infection. Negative results do not preclude SARS-CoV-2 infection, do not rule out co-infections with other pathogens, and should not be used as the sole basis for treatment or other patient management decisions. Negative results must be combined with clinical observations, patient history, and epidemiological information. The expected result is Negative.  Fact Sheet for Patients: 10/14/19  Fact Sheet for Healthcare Providers: HairSlick.no  This test is  not yet approved or cleared by the [place] and  has been authorized for detection and/or diagnosis of SARS-CoV-2 by FDA under an Emergency Use Authorization (EUA). This EUA will remain  in effect (meaning this test can be used) for the duration of the COVID-19 declaration under Se ction 564(b)(1) of the Act, 21 U.S.C. section 360bbb-3(b)(1), unless the authorization is terminated or revoked sooner.  Performed at [facility], [address], [place], [place] [address]   CBC     Status: Abnormal   Collection Time: 10/12/19  9:21 AM  Result Value Ref Range   WBC 5.5 4.0 - 10.5 K/uL   RBC 4.30 3.87 - 5.11 MIL/uL   Hemoglobin 9.9 (L) 12.0 - 15.0 g/dL   HCT 44.3 (L) 36 - 46 %   MCV 76.5 (L) 80.0 - 100.0 fL   MCH 23.0 (L) 26.0 - 34.0 pg   MCHC 30.1 30.0 - 36.0 g/dL   RDW 15.4 00.8 - 67.6 %   Platelets 246 150 - 400 K/uL   nRBC 0.0 0.0 - 0.2 %    Comment: Performed at [facility], [address] [place], [place] [address]  RPR     Status: None   Collection Time: 10/12/19  9:21 AM   Result Value Ref Range   RPR Ser Ql NON REACTIVE NON REACTIVE    Comment: Performed at [facility], [address], [place], [place] [address]  Type and screen     Status: None   Collection Time: 10/12/19  9:21 AM  Result Value Ref Range   ABO/RH(D) O POS    Antibody Screen NEG    Sample Expiration      10/15/2019,2359 Performed at [facility], [address] [place], [place] [address]   ABO/Rh     Status: None   Collection Time: 10/12/19  9:21 AM  Result Value Ref Range   ABO/RH(D)      O POS Performed at [facility], [address] [place], [place] [address]     Assessment and Plan: Izetta Sakamoto is a 29 y.o. [phone] at [redacted]w[redacted]d being admitted for scheduled cesarean section. The risks of cesarean section were discussed with the patient including but were not limited to: bleeding which may require transfusion or reoperation; infection which may require antibiotics; injury to bowel, bladder, ureters or other surrounding organs; injury to the fetus; need for additional procedures including hysterectomy in the event of a life-threatening hemorrhage; formation of adhesions; placental abnormalities wth subsequent pregnancies; incisional problems; thromboembolic phenomenon and other postoperative/anesthesia complications.  Patient also desires permanent sterilization.  Other reversible forms of contraception were discussed with patient; she declines all other modalities. This will be done either via bilateral salpingectomy or bilateral application of Filshie clips.  Risks of procedure discussed with patient including but not limited to: risk of regret, permanence of method, bleeding, infection, injury to surrounding organs and need for additional procedures.  Failure risk of about 1% with increased risk of ectopic gestation if pregnancy occurs was also discussed with patient.  Also discussed possibility of post-tubal pain syndrome. The patient concurred with the proposed  plan, giving informed written consent for the procedures.  Patient has been NPO since midnight she will remain NPO for procedure. Anesthesia and OR aware.  Preoperative prophylactic antibiotics and SCDs ordered on call to the OR.  To OR when ready.  Pregnancy Complications:  - Hx of PE on Lovenox: last  dose Tuesday night; will resume for 6 weeks PP - Hx of PPH with Hgb 9.9; TXA ordered prior to delivery; consented for hysterectomy as well Contraception: BTL; discussed possible salpingectomy and patient agreeable MOF: Bottle Circumcision: NA; girl   Jerilynn Birkenhead, MD OB Family Medicine Fellow, [facility] for [employer], [facility]

## 2019-10-14 NOTE — Progress Notes (Signed)
Called to evaluate patient with excessive passage of large clots. Patient is without complaints and still numb from anesthesia. EBL of 600 CC noted already  SSE: lower uterine segment filled with clots. Bimanual uterine massage revealed a firm uterus filled with clots.  Bakri balloon placed and inflated to 480cc  A/P 29 yo POD#0 s/p repeat c-section with BTL with PPH - Stat cbc - Total EBL 1428 cc - Discussed benefits of blood transfusion who agrees. Awaiting STAT hg - Continue close monitoring of vaginal bleeding

## 2019-10-14 NOTE — Anesthesia Postprocedure Evaluation (Signed)
Anesthesia Post Note  Patient: Sophia Olson  Procedure(s) Performed: CESAREAN SECTION WITH BILATERAL TUBAL LIGATION (Bilateral Abdomen)     Patient location during evaluation: PACU Anesthesia Type: Combined Spinal/Epidural Level of consciousness: awake and alert and oriented Pain management: pain level controlled Vital Signs Assessment: post-procedure vital signs reviewed and stable Respiratory status: spontaneous breathing, nonlabored ventilation and respiratory function stable Cardiovascular status: blood pressure returned to baseline Postop Assessment: epidural receding, no apparent nausea or vomiting, no headache and no backache Anesthetic complications: no Comments: PPH in PACU with approx 1400cc EBL in PACU. Manual extraction of products by OB, Bakri placed. VSS throughout. CBC drawn and pending, OB team will followup. Patient has no complaints. Epidural catheter left in place in case patient requires return to OR. Postpartum RN to check with anesthesia prior to removal of catheter. Stephannie Peters, MD   No complications documented.  Last Vitals:  Vitals:   10/14/19 1345 10/14/19 1400  BP: 125/72 131/66  Pulse: 75 78  Resp: 10 11  Temp:    SpO2: 97% 99%    Last Pain:  Vitals:   10/14/19 1345  TempSrc:   PainSc: 0-No pain   Pain Goal:    LLE Motor Response: Purposeful movement (10/14/19 1330)   RLE Motor Response: Purposeful movement (10/14/19 1330)       Epidural/Spinal Function Cutaneous sensation: Able to Discern Pressure (10/14/19 1345), Patient able to flex knees: Yes (10/14/19 1345), Patient able to lift hips off bed: No (10/14/19 1345), Back pain beyond tenderness at insertion site: No (10/14/19 1345), Progressively worsening motor and/or sensory loss: No (10/14/19 1345), Bowel and/or bladder incontinence post epidural: No (10/14/19 1345)  Kaylyn Layer

## 2019-10-15 DIAGNOSIS — O9081 Anemia of the puerperium: Secondary | ICD-10-CM

## 2019-10-15 LAB — CBC
HCT: 27.6 % — ABNORMAL LOW (ref 36.0–46.0)
Hemoglobin: 8.5 g/dL — ABNORMAL LOW (ref 12.0–15.0)
MCH: 23.5 pg — ABNORMAL LOW (ref 26.0–34.0)
MCHC: 30.8 g/dL (ref 30.0–36.0)
MCV: 76.2 fL — ABNORMAL LOW (ref 80.0–100.0)
Platelets: 235 10*3/uL (ref 150–400)
RBC: 3.62 MIL/uL — ABNORMAL LOW (ref 3.87–5.11)
RDW: 14.9 % (ref 11.5–15.5)
WBC: 13.5 10*3/uL — ABNORMAL HIGH (ref 4.0–10.5)
nRBC: 0 % (ref 0.0–0.2)

## 2019-10-15 LAB — SURGICAL PATHOLOGY

## 2019-10-15 LAB — BIRTH TISSUE RECOVERY COLLECTION (PLACENTA DONATION)

## 2019-10-15 MED ORDER — SODIUM CHLORIDE 0.9 % IV SOLN
510.0000 mg | Freq: Once | INTRAVENOUS | Status: AC
  Administered 2019-10-16: 510 mg via INTRAVENOUS
  Filled 2019-10-15: qty 17

## 2019-10-15 NOTE — Progress Notes (Signed)
Subjective: Postpartum Day 1: Cesarean Delivery Patient reports feeling well. She denies chest pain, SOB, lightheadedness/dizziness. She has not ambulated since her delivery  Objective: Vital signs in last 24 hours: Temp:  [97.4 F (36.3 C)-98.6 F (37 C)] 98.4 F (36.9 C) (06/25 0431) Pulse Rate:  [72-107] 77 (06/25 0431) Resp:  [10-20] 18 (06/25 0431) BP: (90-142)/(51-97) 110/73 (06/25 0431) SpO2:  [95 %-100 %] 99 % (06/25 0431) Weight:  [163.3 kg] 163.3 kg (06/24 0831) Total I/O In: 1601.1 [P.O.:240; I.V.:1361.1] Out: 611 [Urine:550; Drains:61]  Physical Exam:  General: alert, cooperative and no distress Lochia: appropriate Uterine Fundus: firm Incision: Prevena wound vac in place and functional DVT Evaluation: No evidence of DVT seen on physical exam.  Recent Labs    10/14/19 1324 10/14/19 1459  HGB 9.8* 9.8*  HCT 33.0* 32.1*    Assessment/Plan: Status post Cesarean section. Doing well postoperatively.  Patient with PPH yesterday with Bakri balloon in place - Follow up am cbc - will discontinue foley - Will slowly deflate Bakri ballon - Continue current postpartum care.  Sophia Olson 10/15/2019, 6:28 AM

## 2019-10-16 LAB — CBC
HCT: 24.5 % — ABNORMAL LOW (ref 36.0–46.0)
Hemoglobin: 7.6 g/dL — ABNORMAL LOW (ref 12.0–15.0)
MCH: 23.8 pg — ABNORMAL LOW (ref 26.0–34.0)
MCHC: 31 g/dL (ref 30.0–36.0)
MCV: 76.6 fL — ABNORMAL LOW (ref 80.0–100.0)
Platelets: 221 10*3/uL (ref 150–400)
RBC: 3.2 MIL/uL — ABNORMAL LOW (ref 3.87–5.11)
RDW: 15 % (ref 11.5–15.5)
WBC: 7.2 10*3/uL (ref 4.0–10.5)
nRBC: 0 % (ref 0.0–0.2)

## 2019-10-16 MED ORDER — ENOXAPARIN SODIUM 80 MG/0.8ML ~~LOC~~ SOLN: 80.0000 mg | SUBCUTANEOUS | 0 refills | Status: DC

## 2019-10-16 MED ORDER — POLYSACCHARIDE IRON COMPLEX 150 MG PO CAPS: 150.0000 mg | ORAL_CAPSULE | Freq: Every day | ORAL | 3 refills | Status: DC

## 2019-10-16 MED ORDER — SODIUM CHLORIDE 0.9 % IV SOLN: INTRAVENOUS | Status: DC | PRN

## 2019-10-16 MED ORDER — IBUPROFEN 800 MG PO TABS: 800.0000 mg | ORAL_TABLET | Freq: Three times a day (TID) | ORAL | 2 refills | Status: DC | PRN

## 2019-10-16 MED ORDER — OXYCODONE HCL 5 MG PO TABS: 5.0000 mg | ORAL_TABLET | Freq: Four times a day (QID) | ORAL | 0 refills | Status: DC | PRN

## 2019-10-16 MED ORDER — SENNOSIDES-DOCUSATE SODIUM 8.6-50 MG PO TABS: 2.0000 | ORAL_TABLET | Freq: Every evening | ORAL | 2 refills | Status: DC | PRN

## 2019-10-16 NOTE — Discharge Instructions (Signed)

## 2019-10-21 ENCOUNTER — Ambulatory Visit (INDEPENDENT_AMBULATORY_CARE_PROVIDER_SITE_OTHER): Payer: Medicaid Other | Admitting: *Deleted

## 2019-10-21 ENCOUNTER — Other Ambulatory Visit: Payer: Self-pay

## 2019-10-21 ENCOUNTER — Encounter: Payer: Self-pay | Admitting: *Deleted

## 2019-10-21 VITALS — BP 131/77 | HR 105 | Ht 68.0 in | Wt 361.4 lb

## 2019-10-21 DIAGNOSIS — Z4889 Encounter for other specified surgical aftercare: Secondary | ICD-10-CM

## 2019-10-21 DIAGNOSIS — O1205 Gestational edema, complicating the puerperium: Secondary | ICD-10-CM

## 2019-10-21 DIAGNOSIS — R519 Headache, unspecified: Secondary | ICD-10-CM

## 2019-10-21 NOTE — Progress Notes (Signed)
Pt present for incision check following C/S & BTS on 10/14/19. Prevena dressing removed and incision was assessed. No redness, swelling or drainage observed. Two small areas noted where the skin edges were not well approximated. Silver Nitrate applied to these areas by Dr. Shawnie Pons. Proper cleansing of the incision was discussed. Pt states she is checking BP every other Karrington Mccravy and reports values of 118-122 systolic and 70-80 diastolic. She has noticed increased swelling of hands, feet and ankles x3 days. She endorses occasional H/A which is relieved with Tylenol. She denies having blurry vision, dizziness or spots in front of eyes. Pt was advised to check BP 1-2 times daily and to notify us if she has BP >140/90, severe H/A or visual disturbances. If these symptoms occur during hours when the office is closed, pt should go to [facility]U for evaluation. She voiced understanding of all information and instructions given.

## 2019-10-22 NOTE — Progress Notes (Signed)
Patient seen and assessed by nursing staff and myself.  Agree with documentation and plan.

## 2019-11-16 ENCOUNTER — Other Ambulatory Visit: Payer: Self-pay

## 2019-11-16 ENCOUNTER — Telehealth (INDEPENDENT_AMBULATORY_CARE_PROVIDER_SITE_OTHER): Payer: Medicaid Other | Admitting: Certified Nurse Midwife

## 2019-11-16 ENCOUNTER — Encounter: Payer: Self-pay | Admitting: Certified Nurse Midwife

## 2019-11-16 DIAGNOSIS — Z9851 Tubal ligation status: Secondary | ICD-10-CM

## 2019-11-16 DIAGNOSIS — Z98891 History of uterine scar from previous surgery: Secondary | ICD-10-CM

## 2019-11-16 NOTE — Progress Notes (Signed)
I connected with@ on 11/16/19 at  2:15 PM EDT by: Mychart video and verified that I am speaking with the correct person using two identifiers.  Patient is located at home and provider is located at [employer] for [employer] at [employer] [facility] .     The purpose of this virtual visit is to provide medical care while limiting exposure to the novel coronavirus. I discussed the limitations, risks, security and privacy concerns of performing an evaluation and management service by video and the availability of in person appointments. I also discussed with the patient that there may be a patient responsible charge related to this service. By engaging in this virtual visit, you consent to the provision of healthcare.  Additionally, you authorize for your insurance to be billed for the services provided during this visit.  The patient expressed understanding and agreed to proceed.  The following staff members participated in the virtual visit:  Marylynn Pearson, RN and Edd Arbour, CNM  Post Partum Visit Note Subjective:   Sophia Olson is a 29 y.o. [phone] female being evaluated for postpartum followup.  She is 4 weeks postpartum following a repeat cesarean section at  39.4 gestational weeks.  I have fully reviewed the prenatal and intrapartum course; pregnancy complicated by history of PE during pregnancy and 3 prior CS.  Postpartum course has been normal. Baby is doing well. Baby is feeding by bottle - Gerber Gentle. Bleeding staining only. Bowel function is normal. Bladder function is normal. Patient is not sexually active. Contraception method is tubal ligation. Postpartum depression screening: negative.   The pregnancy intention screening data noted above was reviewed. Potential methods of contraception were discussed. The patient elected to proceed with Female Sterilization.   The following portions of the patient's history were reviewed and updated as appropriate: allergies, current  medications, past family history, past medical history, past social history, past surgical history and problem list.  Review of Systems A comprehensive review of systems was negative.   Objective:  There were no vitals filed for this visit. Pt will take BP and send in via MyChart later today.       Assessment:    Routine postpartum exam.  Plan:  Essential components of care per ACOG recommendations:  1.  Mood and well being: Patient with negative depression screening today. Reviewed local resources for support.  - Patient does not use tobacco.  - hx of drug use? No    2. Infant care and feeding:  -Patient currently breastmilk feeding? No, no problems with cessation of lactation -Social determinants of health (SDOH) reviewed in EPIC. No concerns  3. Sexuality, contraception and birth spacing - Patient does not want a pregnancy in the next year.  Desired family size is 4 children.  - Reviewed forms of contraception in tiered fashion. Patient had a BTL after her CS.    4. Sleep and fatigue -Encouraged family/partner/community support of 4 hrs of uninterrupted sleep to help with mood and fatigue  5. Physical Recovery  - Discussed patients delivery and complications - Patient had CS incision, healing well.  - Patient has urinary incontinence? No - Patient is safe to resume physical and sexual activity  6.  Health Maintenance - Last pap smear done 04/12/19 and was normal with negative HPV. - Mammogram not indicated   7. No Chronic Disease   10 minutes of non-face-to-face time spent with the patient   Edd Arbour, CNM, MSN, [facility] 11/16/19 3:24 PM

## 2019-11-16 NOTE — Patient Instructions (Signed)

## 2020-01-16 ENCOUNTER — Emergency Department: Payer: Medicaid Other

## 2020-01-16 ENCOUNTER — Emergency Department
Admission: EM | Admit: 2020-01-16 | Discharge: 2020-01-16 | Disposition: A | Discharge status: Home / Self Care | Payer: Medicaid Other | Attending: Emergency Medicine | Admitting: Emergency Medicine

## 2020-01-16 ENCOUNTER — Other Ambulatory Visit: Payer: Self-pay

## 2020-01-16 DIAGNOSIS — U071 COVID-19: Secondary | ICD-10-CM | POA: Insufficient documentation

## 2020-01-16 DIAGNOSIS — Z20822 Contact with and (suspected) exposure to covid-19: Secondary | ICD-10-CM

## 2020-01-16 DIAGNOSIS — R0981 Nasal congestion: Secondary | ICD-10-CM | POA: Diagnosis present

## 2020-01-16 LAB — RESPIRATORY PANEL BY RT PCR (FLU A&B, COVID)
Influenza A by PCR: NEGATIVE
Influenza B by PCR: NEGATIVE
SARS Coronavirus 2 by RT PCR: POSITIVE — AB

## 2020-01-16 MED ORDER — PSEUDOEPH-BROMPHEN-DM 30-2-10 MG/5ML PO SYRP
5.0000 mL | ORAL_SOLUTION | Freq: Four times a day (QID) | ORAL | 0 refills | Status: DC | PRN
Start: 2020-01-16 — End: 2021-08-22

## 2020-01-16 NOTE — ED Provider Notes (Signed)
[facility] Emergency Department Provider Note  ____________________________________________  Time seen: Approximately 12:08 PM  I have reviewed the triage vital signs and the nursing notes.   HISTORY  Chief Complaint Covid Exposure (Now with symptoms)    HPI Sophia Olson is a 29 y.o. female that presents to the emergency department for evaluation of nasal congestion, sore throat, nonproductive cough, chills for 3 days.  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  She has not had a Covid vaccine.  No shortness of breath, chest pain, vomiting, abdominal pain, diarrhea.   Past Medical History:  Diagnosis Date   Blood transfusion without reported diagnosis    History of postpartum hemorrhage    History of preterm delivery, currently pregnant    History of seizures    as a child; unknown cause   Preterm labor    Seizures (HCC)    Thromboembolism during pregnancy     Patient Active Problem List   Diagnosis Date Noted   Postpartum anemia 10/15/2019   Previous cesarean section complicating pregnancy, antepartum condition or complication 10/14/2019   PPH (postpartum hemorrhage) 10/14/2019   Alpha thalassemia silent carrier 05/19/2019   Genetic carrier 05/19/2019   Unwanted fertility 04/29/2019   Supervision of high risk pregnancy, antepartum 04/26/2019   Obesity, morbid, BMI 50 or higher (HCC) 04/26/2019   History of pulmonary embolus during pregnancy    History of preterm delivery, currently pregnant    History of low birth weight    History of 3 cesarean sections     Past Surgical History:  Procedure Laterality Date   CESAREAN SECTION     x3   CESAREAN SECTION WITH BILATERAL TUBAL LIGATION Bilateral 10/14/2019   Procedure: CESAREAN SECTION WITH BILATERAL TUBAL LIGATION;  Surgeon: Catalina Antigua, MD;  Location: [facility] LD ORS;  Service: Obstetrics;  Laterality: Bilateral;   WISDOM TOOTH EXTRACTION       Prior to Admission medications   Medication Sig Start Date End Date Taking? Authorizing Provider  brompheniramine-pseudoephedrine-DM 30-2-10 MG/5ML syrup Take 5 mLs by mouth 4 (four) times daily as needed. 01/16/20   Enid Derry, PA-C  enoxaparin (LOVENOX) 80 MG/0.8ML injection Inject 0.8 mLs (80 mg total) into the skin daily. 10/17/19 11/28/19  Tereso Newcomer, MD  ibuprofen (ADVIL) 800 MG tablet Take 1 tablet (800 mg total) by mouth 3 (three) times daily with meals as needed for moderate pain or cramping. 10/16/19   Anyanwu, Jethro Bastos, MD  Prenatal Vit-Fe Fumarate-FA (PRENATAL MULTIVITAMIN) TABS tablet Take 1 tablet by mouth daily at 12 noon.    [provider]    Allergies Patient has no known allergies.  History reviewed. No pertinent family history.  Social History Social History   Tobacco Use   Smoking status: Never Smoker   Smokeless tobacco: Never Used  Building services engineer Use: Never used  Substance Use Topics   Alcohol use: No   Drug use: No     Review of Systems  Constitutional: Positive for chills. ENT: Positive for nasal congestion. Cardiovascular: No chest pain. Respiratory: Positive for nonproductive cough.  No SOB. Gastrointestinal: No abdominal pain.  No nausea, no vomiting.  Genitourinary: Negative for dysuria. Musculoskeletal: Negative for musculoskeletal pain. Skin: Negative for rash, abrasions, lacerations, ecchymosis. Neurological: Negative for headaches, numbness or tingling   ____________________________________________   PHYSICAL EXAM:  VITAL SIGNS: ED Triage Vitals  Enc Vitals Group     BP 01/16/20 0848 121/72     Pulse  Rate 01/16/20 0848 (!) 103     Resp 01/16/20 0848 20     Temp 01/16/20 0848 99.1 F (37.3 C)     Temp Source 01/16/20 0848 Oral     SpO2 01/16/20 0848 98 %     Weight 01/16/20 0849 (!) 320 lb (145.2 kg)     Height 01/16/20 0849 5\' 8"  (1.727 m)     Head Circumference --      Peak Flow --      Pain  Score 01/16/20 0849 7     Pain Loc --      Pain Edu? --      Excl. in GC? --      Constitutional: Alert and oriented. Well appearing and in no acute distress. Eyes: Conjunctivae are normal. PERRL. EOMI. Head: Atraumatic. ENT:      Ears:      Nose: Mild congestion/rhinnorhea.      Mouth/Throat: Mucous membranes are moist.  Oropharynx nonerythematous.  Tonsils not enlarged. Neck: No stridor.   Cardiovascular: Normal rate, regular rhythm.  Good peripheral circulation. Respiratory: Normal respiratory effort without tachypnea or retractions. Lungs CTAB. Good air entry to the bases with no decreased or absent breath sounds. Gastrointestinal: Bowel sounds 4 quadrants. Soft and nontender to palpation. No guarding or rigidity. No palpable masses. No distention. Musculoskeletal: Full range of motion to all extremities. No gross deformities appreciated. Neurologic:  Normal speech and language. No gross focal neurologic deficits are appreciated.  Skin:  Skin is warm, dry and intact. No rash noted. Psychiatric: Mood and affect are normal. Speech and behavior are normal. Patient exhibits appropriate insight and judgement.   ____________________________________________   LABS (all labs ordered are listed, but only abnormal results are displayed)  Labs Reviewed  RESPIRATORY PANEL BY RT PCR (FLU A&B, COVID) - Abnormal; Notable for the following components:      Result Value   SARS Coronavirus 2 by RT PCR POSITIVE (*)    All other components within normal limits   ____________________________________________  EKG   ____________________________________________  RADIOLOGY 01/18/20, personally viewed and evaluated these images (plain radiographs) as part of my medical decision making, as well as reviewing the written report by the radiologist.  DG Chest 2 View  Result Date: 01/16/2020 CLINICAL DATA:  Cough and fever with recent COVID exposure EXAM: CHEST - 2 VIEW COMPARISON:  None.  FINDINGS: The heart size and mediastinal contours are within normal limits. Both lungs are clear. The visualized skeletal structures are unremarkable. IMPRESSION: No active cardiopulmonary disease. Electronically Signed   By: 01/18/2020 M.D.   On: 01/16/2020 09:52    ____________________________________________    PROCEDURES  Procedure(s) performed:    Procedures    Medications - No data to display   ____________________________________________   INITIAL IMPRESSION / ASSESSMENT AND PLAN / ED COURSE  Pertinent labs & imaging results that were available during my care of the patient were reviewed by me and considered in my medical decision making (see chart for details).  Review of the [place] CSRS was performed in accordance of the [facility] prior to dispensing any controlled drugs.     Patient's diagnosis is consistent withCOVID-19.  Vital signs and exam are reassuring.  Chest x-ray negative for acute cardiopulmonary processes.  Covid test is positive. Patient will be discharged home with prescriptions for Bromfed. Patient is to follow up with primary care as directed. Patient is given ED precautions to return to the ED for any worsening or new symptoms.  Sophia Olson was evaluated in Emergency Department on 01/16/2020 for the symptoms described in the history of present illness. She was evaluated in the context of the global COVID-19 pandemic, which necessitated consideration that the patient might be at risk for infection with the SARS-CoV-2 virus that causes COVID-19. Institutional protocols and algorithms that pertain to the evaluation of patients at risk for COVID-19 are in a state of rapid change based on information released by regulatory bodies including the CDC and federal and state organizations. These policies and algorithms were followed during the patient's care in the ED.   ____________________________________________  FINAL CLINICAL IMPRESSION(S) / ED  DIAGNOSES  Final diagnoses:  Suspected COVID-19 virus infection      NEW MEDICATIONS STARTED DURING THIS VISIT:  ED Discharge Orders         Ordered    brompheniramine-pseudoephedrine-DM 30-2-10 MG/5ML syrup  4 times daily PRN        01/16/20 1230              This chart was dictated using voice recognition software/Dragon. Despite best efforts to proofread, errors can occur which can change the meaning. Any change was purely unintentional.    Enid Derry, PA-C 01/16/20 1534    Dionne Bucy, MD 01/17/20 2259

## 2020-01-16 NOTE — ED Triage Notes (Signed)
Exposed at work to [employer] on Thursday and is now exhibiting symptoms of covid to include sore throat, headache, body aches, fatigue and no appetite.

## 2020-01-16 NOTE — ED Notes (Signed)
Pt attempted to sign on esig pad, pad not working.

## 2020-01-16 NOTE — ED Notes (Signed)
Pt informed of  covid status. Given instructions for care and quarantine. Pt instructed to return with SOB or worsening symptoms.

## 2020-01-16 NOTE — ED Notes (Addendum)
See triage note, no cough or sob noted at this time. Pt is AOX4, NAD noted, pt ambulatory. Pt c/o nausea, denies having thrown up.

## 2020-01-17 ENCOUNTER — Telehealth (HOSPITAL_COMMUNITY): Payer: Self-pay | Admitting: Adult Health

## 2020-01-17 NOTE — Telephone Encounter (Signed)
Called patient regarding monoclonal antibody treatment for COVID 19 given to those who are at risk for complications and/or hospitalization of the virus.  Patient meets criteria based on: bmi greater than 25.  She would like to do more research and will call us back if she decides she wants treatment.  Sx onset 01/13/2020.  Call back number given: [phone]  My chart message: sent  Lillard Anes, NP

## 2020-03-28 ENCOUNTER — Encounter: Payer: Self-pay | Admitting: General Practice

## 2021-03-10 ENCOUNTER — Other Ambulatory Visit: Payer: Self-pay

## 2021-03-10 ENCOUNTER — Emergency Department
Admission: EM | Admit: 2021-03-10 | Discharge: 2021-03-10 | Disposition: A | Discharge status: Home / Self Care | Payer: Medicaid Other | Attending: Emergency Medicine | Admitting: Emergency Medicine

## 2021-03-10 DIAGNOSIS — Z20822 Contact with and (suspected) exposure to covid-19: Secondary | ICD-10-CM | POA: Diagnosis not present

## 2021-03-10 DIAGNOSIS — J101 Influenza due to other identified influenza virus with other respiratory manifestations: Secondary | ICD-10-CM

## 2021-03-10 DIAGNOSIS — R509 Fever, unspecified: Secondary | ICD-10-CM | POA: Diagnosis present

## 2021-03-10 LAB — RESP PANEL BY RT-PCR (FLU A&B, COVID) ARPGX2
Influenza A by PCR: POSITIVE — AB
Influenza B by PCR: NEGATIVE
SARS Coronavirus 2 by RT PCR: NEGATIVE

## 2021-03-10 MED ORDER — IBUPROFEN 800 MG PO TABS
800.0000 mg | ORAL_TABLET | Freq: Once | ORAL | Status: AC
  Administered 2021-03-10: 800 mg via ORAL
  Filled 2021-03-10: qty 1

## 2021-03-10 MED ORDER — BENZONATATE 100 MG PO CAPS
100.0000 mg | ORAL_CAPSULE | Freq: Three times a day (TID) | ORAL | 0 refills | Status: DC | PRN
Start: 2021-03-10 — End: 2021-08-22

## 2021-03-10 MED ORDER — OSELTAMIVIR PHOSPHATE 75 MG PO CAPS: 75.0000 mg | ORAL_CAPSULE | Freq: Two times a day (BID) | ORAL | 0 refills | Status: AC

## 2021-03-10 NOTE — ED Provider Notes (Signed)
[facility] Emergency Department Provider Note  ____________________________________________   Event Date/Time   First MD Initiated Contact with Patient 03/10/21 1154     (approximate)  I have reviewed the triage vital signs and the nursing notes.   HISTORY  Chief Complaint Generalized Body Aches    HPI Sophia Olson is a 30 y.o. femaleflulike symptoms, patient is complained of fever, chills, body aches.  cough, sore throat, denies vomiting, diarrhea; denies chest pain or sob.  Sx for 2 days   Past Medical History:  Diagnosis Date   Blood transfusion without reported diagnosis    History of postpartum hemorrhage    History of preterm delivery, currently pregnant    History of seizures    as a child; unknown cause   Preterm labor    Seizures (HCC)    Thromboembolism during pregnancy     Patient Active Problem List   Diagnosis Date Noted   Postpartum anemia 10/15/2019   Previous cesarean section complicating pregnancy, antepartum condition or complication 10/14/2019   PPH (postpartum hemorrhage) 10/14/2019   Alpha thalassemia silent carrier 05/19/2019   Genetic carrier 05/19/2019   Unwanted fertility 04/29/2019   Supervision of high risk pregnancy, antepartum 04/26/2019   Obesity, morbid, BMI 50 or higher (HCC) 04/26/2019   History of pulmonary embolus during pregnancy    History of preterm delivery, currently pregnant    History of low birth weight    History of 3 cesarean sections     Past Surgical History:  Procedure Laterality Date   CESAREAN SECTION     x3   CESAREAN SECTION WITH BILATERAL TUBAL LIGATION Bilateral 10/14/2019   Procedure: CESAREAN SECTION WITH BILATERAL TUBAL LIGATION;  Surgeon: Catalina Antigua, MD;  Location: [facility] LD ORS;  Service: Obstetrics;  Laterality: Bilateral;   WISDOM TOOTH EXTRACTION      Prior to Admission medications   Medication Sig Start Date End Date Taking? Authorizing Provider  benzonatate  (TESSALON PERLES) 100 MG capsule Take 1 capsule (100 mg total) by mouth 3 (three) times daily as needed for cough. 03/10/21 03/10/22 Yes Onyx Edgley, Roselyn Bering, PA-C  oseltamivir (TAMIFLU) 75 MG capsule Take 1 capsule (75 mg total) by mouth 2 (two) times daily for 5 days. 03/10/21 03/15/21 Yes Ryker Sudbury, Roselyn Bering, PA-C  brompheniramine-pseudoephedrine-DM 30-2-10 MG/5ML syrup Take 5 mLs by mouth 4 (four) times daily as needed. 01/16/20   Enid Derry, PA-C  enoxaparin (LOVENOX) 80 MG/0.8ML injection Inject 0.8 mLs (80 mg total) into the skin daily. 10/17/19 11/28/19  Tereso Newcomer, MD  ibuprofen (ADVIL) 800 MG tablet Take 1 tablet (800 mg total) by mouth 3 (three) times daily with meals as needed for moderate pain or cramping. 10/16/19   Anyanwu, Jethro Bastos, MD  Prenatal Vit-Fe Fumarate-FA (PRENATAL MULTIVITAMIN) TABS tablet Take 1 tablet by mouth daily at 12 noon.    [provider]    Allergies Patient has no known allergies.  History reviewed. No pertinent family history.  Social History Social History   Tobacco Use   Smoking status: Never   Smokeless tobacco: Never  Vaping Use   Vaping Use: Never used  Substance Use Topics   Alcohol use: No   Drug use: No    Review of Systems  Constitutional: Positive fever/chills Eyes: No visual changes. ENT: Positive sore throat. Cardiovascular: Denies chest pain Respiratory: Positive cough Gastrointestinal: Denies abdominal pain, nausea/vomiting/diarrhea Genitourinary: Negative for dysuria. Musculoskeletal: Negative for back pain. Skin: Negative for rash.    ____________________________________________  PHYSICAL EXAM:  VITAL SIGNS: ED Triage Vitals  Enc Vitals Group     BP 03/10/21 1007 127/87     Pulse Rate 03/10/21 1007 100     Resp 03/10/21 1007 18     Temp 03/10/21 1007 99.6 F (37.6 C)     Temp Source 03/10/21 1007 Oral     SpO2 03/10/21 1007 96 %     Weight 03/10/21 0957 (!) 305 lb (138.3 kg)     Height 03/10/21 0957  5\' 8"  (1.727 m)     Head Circumference --      Peak Flow --      Pain Score 03/10/21 0957 7     Pain Loc --      Pain Edu? --      Excl. in GC? --     Constitutional: Alert and oriented. Well appearing and in no acute distress. Eyes: Conjunctivae are normal.  Head: Atraumatic. Nose: No congestion/rhinnorhea. Mouth/Throat: Mucous membranes are moist.   Neck:  supple no lymphadenopathy noted Cardiovascular: Normal rate, regular rhythm. Heart sounds are normal Respiratory: Normal respiratory effort.  No retractions, lungs c t a  GU: deferred Musculoskeletal: FROM all extremities, warm and well perfused Neurologic:  Normal speech and language.  Skin:  Skin is warm, dry and intact. No rash noted. Psychiatric: Mood and affect are normal. Speech and behavior are normal.  ____________________________________________   LABS (all labs ordered are listed, but only abnormal results are displayed)  Labs Reviewed  RESP PANEL BY RT-PCR (FLU A&B, COVID) ARPGX2 - Abnormal; Notable for the following components:      Result Value   Influenza A by PCR POSITIVE (*)    All other components within normal limits   ____________________________________________   ____________________________________________  RADIOLOGY    ____________________________________________   PROCEDURES  Procedure(s) performed: No  Procedures    ____________________________________________   INITIAL IMPRESSION / ASSESSMENT AND PLAN / ED COURSE  Pertinent labs & imaging results that were available during my care of the patient were reviewed by me and considered in my medical decision making (see chart for details).   The patient is a 30 year old female presents emergency department with fever and body aches.  See HPI.  Physical exam shows patient be stable.  Patient's respiratory panel was positive for influenza A  I did explain the findings to her.  She is placed on Tamiflu.  She is to take Tylenol and  ibuprofen for fever.  She is given a work note.  Instructed to return to the emergency department if worsening and follow-up with her regular doctor if not better in 3 days.  She is discharged stable condition.     As part of my medical decision making, I reviewed the following data within the electronic MEDICAL RECORD NUMBER Nursing notes reviewed and incorporated, Labs reviewed , Old chart reviewed, Notes from prior ED visits, and [place] Controlled Substance Database  ____________________________________________   FINAL CLINICAL IMPRESSION(S) / ED DIAGNOSES  Final diagnoses:  Influenza A      NEW MEDICATIONS STARTED DURING THIS VISIT:  Discharge Medication List as of 03/10/2021 11:46 AM     START taking these medications   Details  benzonatate (TESSALON PERLES) 100 MG capsule Take 1 capsule (100 mg total) by mouth 3 (three) times daily as needed for cough., Starting Sat 03/10/2021, Until Sun 03/10/2022 at 2359, Normal    oseltamivir (TAMIFLU) 75 MG capsule Take 1 capsule (75 mg total) by mouth 2 (two) times daily for  5 days., Starting Sat 03/10/2021, Until Thu 03/15/2021, Normal        Sophia Olson was evaluated in Emergency Department on 03/10/2021 for the symptoms described in the history of present illness. She was evaluated in the context of the global COVID-19 pandemic, which necessitated consideration that the patient might be at risk for infection with the SARS-CoV-2 virus that causes COVID-19. Institutional protocols and algorithms that pertain to the evaluation of patients at risk for COVID-19 are in a state of rapid change based on information released by regulatory bodies including the CDC and federal and state organizations. These policies and algorithms were followed during the patient's care in the ED.   Note:  This document was prepared using Dragon voice recognition software and may include unintentional dictation errors.    Faythe Ghee, PA-C 03/10/21 1511     Sharyn Creamer, MD 03/10/21 Izell Denver

## 2021-03-10 NOTE — Discharge Instructions (Signed)
Tylenol and ibuprofen for fever as needed

## 2021-03-10 NOTE — ED Triage Notes (Signed)
Patient c/o generalized body aches, chills and cough X 2 days.

## 2021-03-10 NOTE — ED Notes (Signed)
Reviewed discharge instructions, follow-up care, and prescriptions with patient. Patient verbalized understanding of all information reviewed. Patient stable, with no distress noted at this time.    

## 2021-07-31 IMAGING — US US MFM OB DETAIL+14 WK
1 series · 12 of 28 positions shown · non-contrast
Comparison: none

[Series 1: us mfm ob detail+14 wk · 73 acquisitions, 12 frames shown]
[im 3/73]
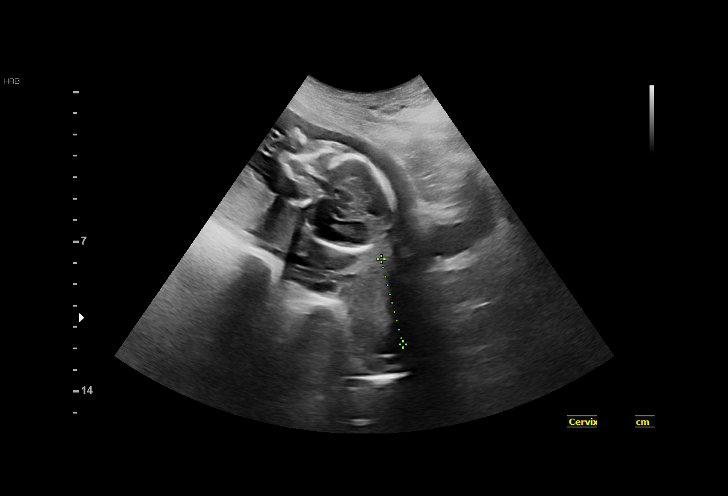
[im 9/73]
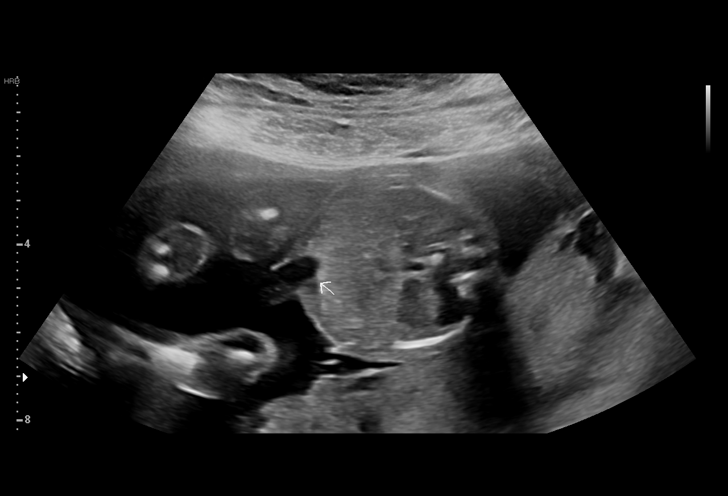
[im 14/73]
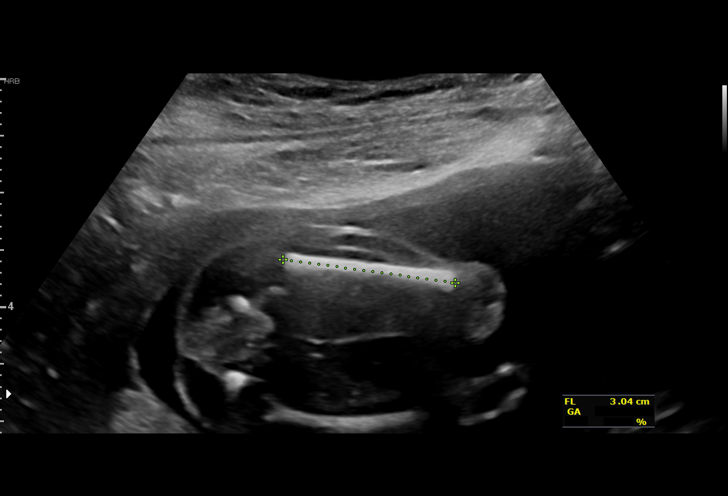
[im 22/73]
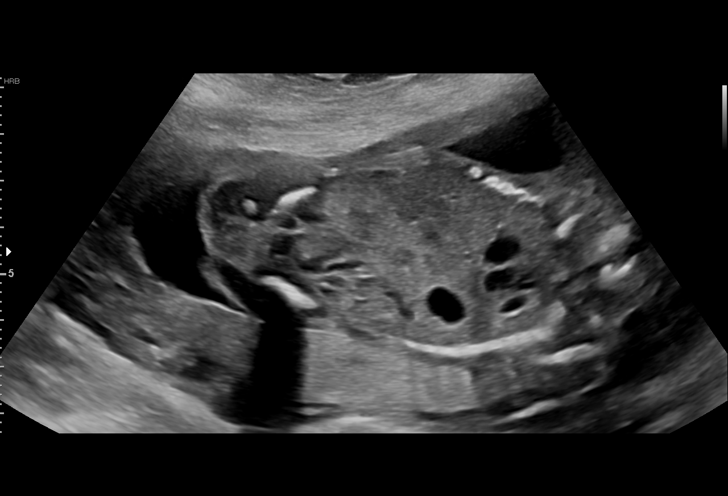
[im 27/73]
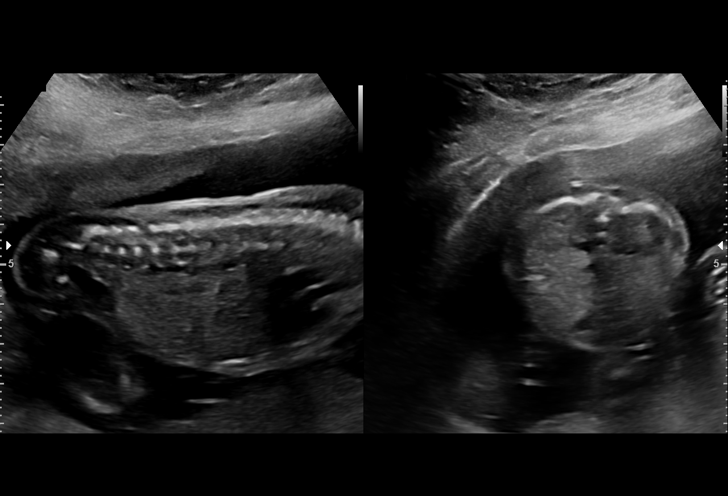
[im 33/73]
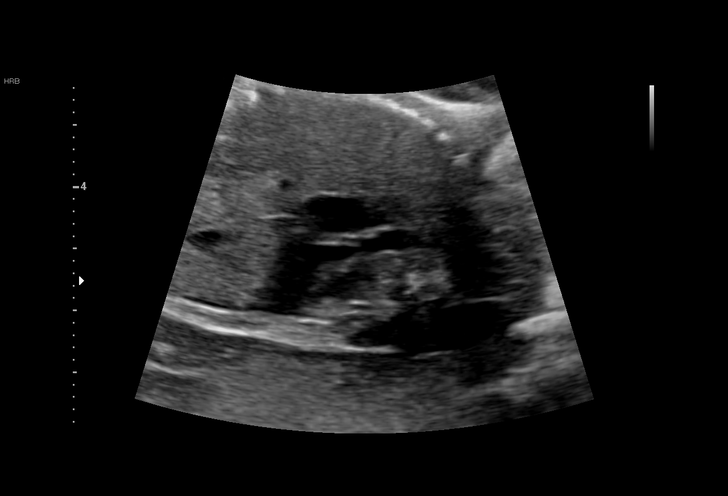
[im 41/73]
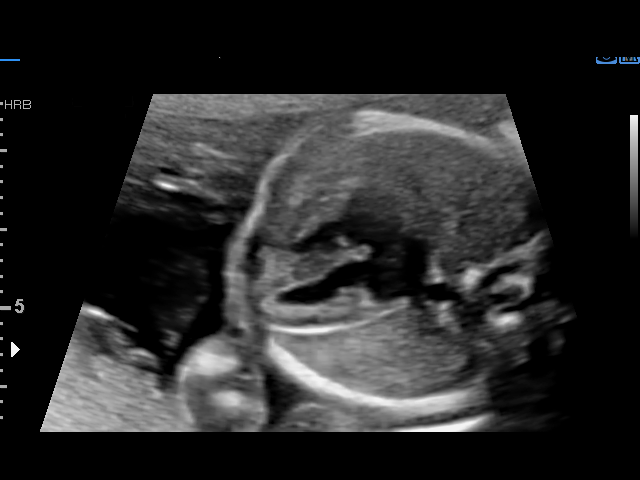
[im 46/73]
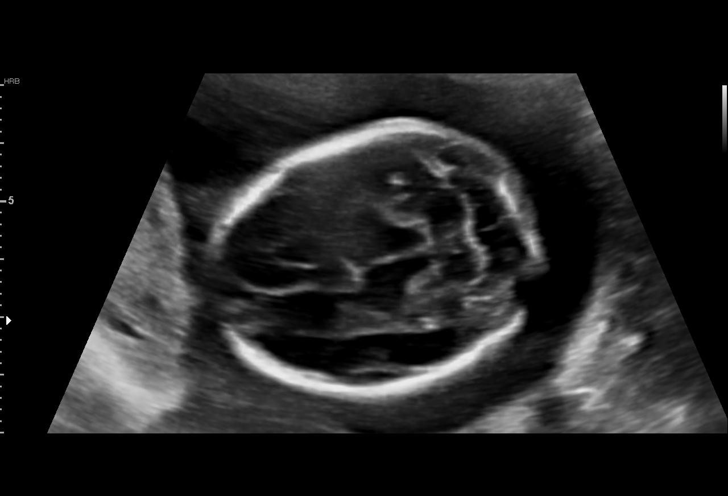
[im 51/73]
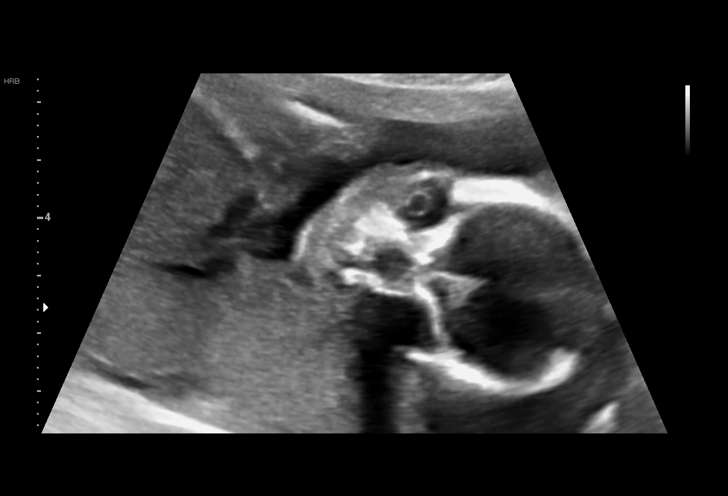
[im 59/73]
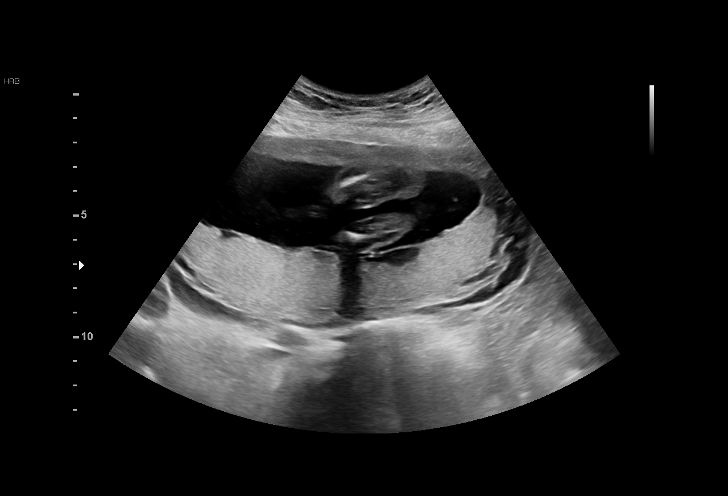
[im 65/73]
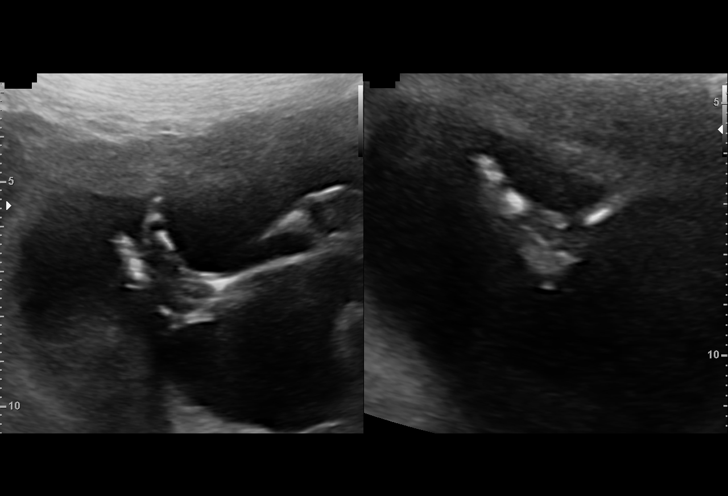
[im 70/73]
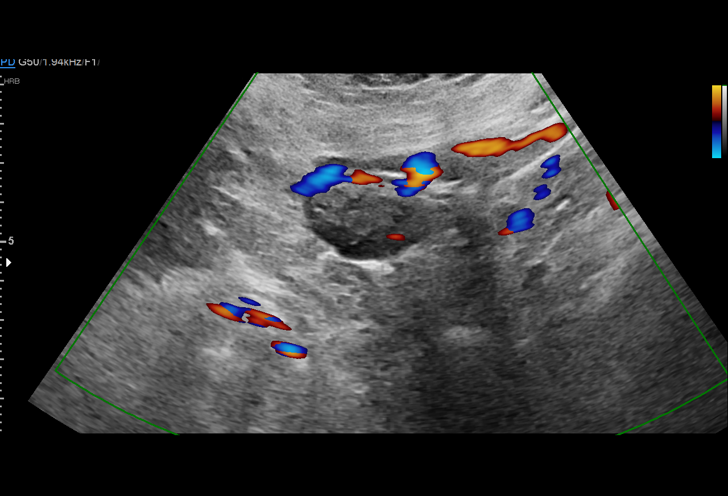

[12 of 28 positions shown; findings below may reference images not displayed]

----------------------------------------------------------------------

 ----------------------------------------------------------------------
Indications

  Encounter for antenatal screening for
  malformations (low risk NIPS)
  History of cesarean delivery x3 (will have
  repeat)
  Genetic carrier (Huang, Geovanny increased
  risk)
  Poor obstetric history: Previous preterm
  delivery (d/t pre-e)
  Poor obstetric history: Previous preeclampsia
  Poor obstetrical history (Pulmonary
  embolism - Lovenox)
  Obesity complicating pregnancy, second
  trimester (BMI 53)
  20 weeks gestation of pregnancy
 ----------------------------------------------------------------------
Vital Signs

 BMI:
Fetal Evaluation

 Num Of Fetuses:         1
 Fetal Heart Rate(bpm):  150
 Cardiac Activity:       Observed
 Presentation:           Cephalic
 Placenta:               Posterior
 P. Cord Insertion:      Visualized, central
 Amniotic Fluid
 AFI FV:      Within normal limits

                             Largest Pocket(cm)

Biometry

 BPD:      43.3  mm     G. Age:  19w 1d         12  %    CI:        71.32   %    70 - 86
                                                         FL/HC:      18.6   %    16.8 -
 HC:      163.3  mm     G. Age:  19w 1d          6  %    HC/AC:      1.08        1.09 -
 AC:      151.5  mm     G. Age:  20w 3d         52  %    FL/BPD:     70.0   %
 FL:       30.3  mm     G. Age:  19w 3d         17  %    FL/AC:      20.0   %    20 - 24
 CER:      19.4  mm     G. Age:  18w 5d         11  %
 LV:        4.4  mm
 CM:        6.6  mm

 Est. FW:     314  gm    0 lb 11 oz      27  %
OB History

 Gravidity:    4         Term:   2        Prem:   1
 Living:       3
Gestational Age

 LMP:           20w 1d        Date:  01/10/19                 EDD:   10/17/19
 U/S Today:     19w 4d                                        EDD:   10/21/19
 Best:          20w 1d     Det. By:  LMP  (01/10/19)          EDD:   10/17/19
Anatomy

 Cranium:               Appears normal         Aortic Arch:            Appears normal
 Cavum:                 Appears normal         Ductal Arch:            Not well visualized
 Ventricles:            Appears normal         Diaphragm:              Appears normal
 Choroid Plexus:        Appears normal         Stomach:                Appears normal, left
                                                                       sided
 Cerebellum:            Appears normal         Abdomen:                Appears normal
 Posterior Fossa:       Appears normal         Abdominal Wall:         Appears nml (cord
                                                                       insert, abd wall)
 Nuchal Fold:           Not applicable (>20    Cord Vessels:           Appears normal (3
                        wks GA)                                        vessel cord)
 Face:                  Orbits nl; profile not Kidneys:                Appear normal
                        well visualized
 Lips:                  Appears normal         Bladder:                Appears normal
 Thoracic:              Appears normal         Spine:                  Appears normal
 Heart:                 Not well visualized    Upper Extremities:      Appears normal
 RVOT:                  Appears normal         Lower Extremities:      Appears normal
 LVOT:                  Appears normal

 Other:  Heels and 5th digit visualized. Technically difficult due to maternal
         habitus and fetal position.
Cervix Uterus Adnexa

 Cervix
 Length:           4.11  cm.
 Normal appearance by transabdominal scan. `
 Uterus
 No abnormality visualized.

 Left Ovary
 Within normal limits.

 Right Ovary
 Within normal limits.

 Cul De Sac
 No free fluid seen.

 Adnexa
 No abnormality visualized.
Impression

 Ms. Jumper, Blain4 P3, is here for fetal anatomy scan.

 Past medical history is significant for pulmonary embolism in
 7773 in her first pregnancy. Patient takes lovenox 40 mg
 once daily. She does not smoke cigarettes and reports being
 active.

 Obstetric history is significant for 3 previous cesarean
 deliveries and all her children are in good health. Her first
 pregnancy was complicated by preeclampsia and preterm
 indicated cesarean delivery. In her most-recent pregnancy,
 she had postpartum hemorrhage and received blood
 transfusion (Lise Kristin Myge).
 Patient is also a silent carrier for alpha thalassemia and has
 an increased carrier risk for spinal muscular atrophy. All her
 children are unaffected. Carrier status of her partner, father of
 her previous 2 children and this pregnancy, is not known.

 We performed fetal anatomy scan. No makers of
 aneuploidies or fetal structural defects are seen. Fetal
 biometry is consistent with her previously-established dates.
 Amniotic fluid is normal and good fetal activity is seen.
 Patient understands the limitations of ultrasound in detecting
 fetal anomalies.
 Placenta is posterior and there is no evidence of previa or
 accreta.
 Maternal obesity imposes limitations on the resolution of
 images, and failure to detect fetal anomalies is more common
 in obese pregnant women. As maternal obesity makes
 clinical assessment of fetal growth difficult, we recommend
 serial growth scans until delivery.

 I counseled the patient on the benefit of low-dose aspirin that
 delays or prevents preeclampsia. Patient is not allergic to
 aspirin and I recommended that she take aspirin 81 mg PO
 daily till delivery.

 I also recommended genetic counseling.
Recommendations

 -An appointment was made for her to return in 4 weeks for
 completion of fetal anatomy.
 -Serial fetal growth assessments every 4 weeks.
                 Padam, Jhemboy

## 2021-08-17 ENCOUNTER — Ambulatory Visit: Payer: Medicaid Other | Admitting: Surgery

## 2021-08-22 ENCOUNTER — Encounter: Payer: Self-pay | Admitting: Surgery

## 2021-08-22 ENCOUNTER — Telehealth: Payer: Self-pay

## 2021-08-22 ENCOUNTER — Ambulatory Visit (INDEPENDENT_AMBULATORY_CARE_PROVIDER_SITE_OTHER): Payer: No Typology Code available for payment source | Admitting: Surgery

## 2021-08-22 ENCOUNTER — Other Ambulatory Visit: Payer: Self-pay

## 2021-08-22 VITALS — BP 104/74 | HR 76 | Temp 98.6°F | Ht 68.0 in | Wt 280.0 lb

## 2021-08-22 DIAGNOSIS — K8020 Calculus of gallbladder without cholecystitis without obstruction: Secondary | ICD-10-CM

## 2021-08-22 NOTE — Patient Instructions (Signed)
Our surgery scheduler will call you within 24-48 hours to schedule your surgery. Please have the Blue surgery sheet available when speaking with her.     Minimally Invasive Cholecystectomy Minimally invasive cholecystectomy is surgery to remove the gallbladder. The gallbladder is a pear-shaped organ that lies beneath the liver on the right side of the body. The gallbladder stores bile, which is a fluid that helps the body digest fats. Cholecystectomy is often done to treat inflammation (irritation and swelling) of the gallbladder (cholecystitis). This condition is usually caused by a buildup of gallstones (cholelithiasis) in the gallbladder or when the fluid in the gall bladder becomes stagnant because gallstones get stuck in the ducts (tubes) and block the flow of bile. This can result in inflammation and pain. In severe cases, emergency surgery may be required. This procedure is done through small incisions in the abdomen, instead of one large incision. It is also called laparoscopic surgery. A thin scope with a camera (laparoscope) is inserted through one incision. Then surgical instruments are inserted through the other incisions. In some cases, a minimally invasive surgery may need to be changed to a surgery that is done through a larger incision. This is called open surgery. Tell a health care provider about: Any allergies you have. All medicines you are taking, including vitamins, herbs, eye drops, creams, and over-the-counter medicines. Any problems you or family members have had with anesthetic medicines. Any bleeding problems you have. Any surgeries you have had. Any medical conditions you have. Whether you are pregnant or may be pregnant. What are the risks? Generally, this is a safe procedure. However, problems may occur, including: Infection. Bleeding. Allergic reactions to medicines. Damage to nearby structures or organs. A gallstone remaining in the common bile duct. The common  bile duct carries bile from the gallbladder to the small intestine. A bile leak from the liver or cystic duct after your gallbladder is removed. What happens before the procedure? When to stop eating and drinking Follow instructions from your health care provider about what you may eat and drink before your procedure. These may include: 8 hours before the procedure Stop eating most foods. Do not eat meat, fried foods, or fatty foods. Eat only light foods, such as toast or crackers. All liquids are okay except energy drinks and alcohol. 6 hours before the procedure Stop eating. Drink only clear liquids, such as water, clear fruit juice, black coffee, plain tea, and sports drinks. Do not drink energy drinks or alcohol. 2 hours before the procedure Stop drinking all liquids. You may be allowed to take medicines with small sips of water. If you do not follow your health care provider's instructions, your procedure may be delayed or canceled. Medicines Ask your health care provider about: Changing or stopping your regular medicines. This is especially important if you are taking diabetes medicines or blood thinners. Taking medicines such as aspirin and ibuprofen. These medicines can thin your blood. Do not take these medicines unless your health care provider tells you to take them. Taking over-the-counter medicines, vitamins, herbs, and supplements. General instructions If you will be going home right after the procedure, plan to have a responsible adult: Take you home from the hospital or clinic. You will not be allowed to drive. Care for you for the time you are told. Do not use any products that contain nicotine or tobacco for at least 4 weeks before the procedure. These products include cigarettes, chewing tobacco, and vaping devices, such as e-cigarettes. If you   need help quitting, ask your health care provider. Ask your health care provider: How your surgery site will be marked. What  steps will be taken to help prevent infection. These may include: Removing hair at the surgery site. Washing skin with a germ-killing soap. Taking antibiotic medicine. What happens during the procedure?  An IV will be inserted into one of your veins. You will be given one or both of the following: A medicine to help you relax (sedative). A medicine to make you fall asleep (general anesthetic). Your surgeon will make several small incisions in your abdomen. The laparoscope will be inserted through one of the small incisions. The camera on the laparoscope will send images to a monitor in the operating room. This lets your surgeon see inside your abdomen. A gas will be pumped into your abdomen. This will expand your abdomen to give the surgeon more room to perform the surgery. Other tools that are needed for the procedure will be inserted through the other incisions. The gallbladder will be removed through one of the incisions. Your common bile duct may be examined. If stones are found in the common bile duct, they may be removed. After your gallbladder has been removed, the incisions will be closed with stitches (sutures), staples, or skin glue. Your incisions will be covered with a bandage (dressing). The procedure may vary among health care providers and hospitals. What happens after the procedure? Your blood pressure, heart rate, breathing rate, and blood oxygen level will be monitored until you leave the hospital or clinic. You will be given medicines as needed to control your pain. You may have a drain placed in the incision. The drain will be removed a day or two after the procedure. Summary Minimally invasive cholecystectomy, also called laparoscopic cholecystectomy, is surgery to remove the gallbladder using small incisions. Tell your health care provider about all the medical conditions you have and all the medicines you are taking for those conditions. Before the procedure, follow  instructions about when to stop eating and drinking and changing or stopping medicines. Plan to have a responsible adult care for you for the time you are told after you leave the hospital or clinic. This information is not intended to replace advice given to you by your health care provider. Make sure you discuss any questions you have with your health care provider. Document Revised: 10/10/2020 Document Reviewed: 10/10/2020 Elsevier Patient Education  2023 [employer]vier Inc.  

## 2021-08-22 NOTE — Progress Notes (Signed)
?08/22/2021 ? ?Reason for Visit:  Symptomatic cholelithiasis ? ?Requesting Provider:  Margurite Auerbach, NP ? ?History of Present Illness: ?Sophia Olson is a 31 y.o. female presenting for evaluation of symptomatic cholelithiasis.  The patient reports few months history of RUQ and epigastric abdominal pain that happens after eating, particularly greasy/fatty foods.  She reports the episodes are happening about every other day.  She notices that eating baked chicken does not cause any issues.  The episodes are associated with nausea and sometimes emesis depending on the severity/intensity.  The episodes last a few hours and have not been constant.  Associated with pain that radiates to her back.  Denies any fevers, chills, chest pain, shortness of breath, diarrhea or constipation. ? ?She had an ultrasound on 08/08/21 which showed a contracted gallbladder with cholelithiasis but no gallbladder wall thickening or pericholecystic fluid.  There is some hepatic steatosis.  She also had labwork done which showed normal WBC, normal LFTs an unremarkable CMP, but she did have a low Hgb of 7.5 and Hct of 27.1.  She does have a history of anemia during her pregnancy and she was just recently started on Iron supplement by her PCP.   ? ?Past Medical History: ?Past Medical History:  ?Diagnosis Date  ? Blood transfusion without reported diagnosis   ? History of postpartum hemorrhage   ? History of preterm delivery, currently pregnant   ? History of seizures   ? as a child; unknown cause  ? Preterm labor   ? Seizures ([place])   ? Thromboembolism during pregnancy   ?  ? ?Past Surgical History: ?Past Surgical History:  ?Procedure Laterality Date  ? CESAREAN SECTION    ? x3  ? CESAREAN SECTION    ? CESAREAN SECTION    ? CESAREAN SECTION WITH BILATERAL TUBAL LIGATION Bilateral 10/14/2019  ? Procedure: CESAREAN SECTION WITH BILATERAL TUBAL LIGATION;  Surgeon: Mora Bellman, MD;  Location: [facility];  Service: Obstetrics;  Laterality:  Bilateral;  ? WISDOM TOOTH EXTRACTION    ? ? ?Home Medications: ?Prior to Admission medications   ?Medication Sig Start Date End Date Taking? Authorizing Provider  ?CALCIUM-VITAMIN D PO Take 1 tablet by mouth daily.    [provider]  ?cholecalciferol (VITAMIN D3) 25 MCG (1000 UNIT) tablet Take 1,000 Units by mouth daily.    [provider]  ?Ferrous Sulfate 27 MG TABS Take 27 mg by mouth 2 (two) times daily.    [provider]  ? ? ?Allergies: ?No Known Allergies ? ?Social History: ? reports that she has never smoked. She has never used smokeless tobacco. She reports that she does not drink alcohol and does not use drugs.  ? ?Family History: ?History reviewed. No pertinent family history. ? ?Review of Systems: ?Review of Systems  ?Constitutional:  Negative for chills and fever.  ?HENT:  Negative for hearing loss.   ?Respiratory:  Negative for shortness of breath.   ?Cardiovascular:  Negative for chest pain.  ?Gastrointestinal:  Positive for abdominal pain, nausea and vomiting. Negative for constipation and diarrhea.  ?Genitourinary:  Negative for dysuria.  ?Musculoskeletal:  Positive for back pain.  ?Skin:  Negative for rash.  ?Neurological:  Negative for dizziness.  ?Psychiatric/Behavioral:  Negative for depression.   ? ?Physical Exam ?BP 104/74   Pulse 76   Temp 98.6 ?F (37 ?C) (Oral)   Ht 5' 8"  (1.727 m)   Wt 280 lb (127 kg)   SpO2 99%   BMI 42.57 kg/m?  ?CONSTITUTIONAL: No  acute distress, well nourished. ?HEENT:  Normocephalic, atraumatic, extraocular motion intact. ?NECK: Trachea is midline, and there is no jugular venous distension.  ?RESPIRATORY:  Lungs are clear, and breath sounds are equal bilaterally. Normal respiratory effort without pathologic use of accessory muscles. ?CARDIOVASCULAR: Heart is regular without murmurs, gallops, or rubs. ?GI: The abdomen is soft, non-distended, currently non-tender to palpation.  Negative Murphy's sign. MUSCULOSKELETAL:  Normal muscle  strength and tone in all four extremities.  No peripheral edema or cyanosis. ?SKIN: Skin turgor is normal. There are no pathologic skin lesions.  ?NEUROLOGIC:  Motor and sensation is grossly normal.  Cranial nerves are grossly intact. ?PSYCH:  Alert and oriented to person, place and time. Affect is normal. ? ?Laboratory Analysis: ?Labs from 07/31/21: ?Na 142, K 4.5, Cl 109, CO2 21, BUN 9, Cr 0.86.  Total bili <0.2, AST 11, ALT 9, Alk Phos 106, Albumin 4.1.  WBC 4.1, Hgb 7.5, Hct 27.1, Plt 353. ? ?Imaging: ?U/S Abdomen 08/02/21: ?IMPRESSION: ?Cholelithiasis with intraluminal sludge development. ?There is no evidence of bile duct obstruction. ?Mild hepatic steatosis.  ? ?Assessment and Plan: ?This is a 31 y.o. female with symptomatic cholelithiasis. ? ?--Discussed with the patient the findings on her labs and U/S and how cholelithiasis develops and creates the episodes of biliary colic.  Discussed the potential for dietary changes with a low fat diet, but given the frequency of her symptoms, I am concerned that conservative measures will not be of much help and she would still need a cholecystectomy.  She is interested in pursuing surgery. ?--Discussed with her the role for a robotic assisted cholecystectomy and reviewed the surgery at length with her including the incisions, risks of bleeding, infection, injury to surrounding structures, that this is an outpatient surgery, post-operative recovery time and post-operative restrictions, the use of ICG during surgery to evaluate the biliary anatomy, and she's willing to proceed. ?--Patient will be tentatively scheduled for 08/30/21.  She will discuss with her job about the timing for surgery and will contact us if the date needs to be changed.  All of her questions have been answered. ?--Given the low Hgb on her labs, will also send for medical clearance to make sure she's ok for surgery or if any other workup needs to be done before. ? ?I spent 80 minutes dedicated to the  care of this patient on the date of this encounter to include pre-visit review of records, face-to-face time with the patient discussing diagnosis and management, and any post-visit coordination of care. ? ? ?Melvyn Neth, MD ?[facility] ? ?  ?

## 2021-08-22 NOTE — Telephone Encounter (Signed)
Medical Clearance faxed to [facility] at this time. ?

## 2021-08-23 ENCOUNTER — Telehealth: Payer: Self-pay | Admitting: Surgery

## 2021-08-23 ENCOUNTER — Telehealth: Payer: Self-pay

## 2021-08-23 ENCOUNTER — Other Ambulatory Visit: Payer: Self-pay | Admitting: Surgery

## 2021-08-23 NOTE — Telephone Encounter (Signed)
Left message for patient to call.  Per Dr. Hampton Abbot, we will need to cancel her surgery for 08/30/21, her Hgb is too low, primary care doctor will not clear her for surgery. When patient is more improved, then can bring her back in the office for follow up and see from there.    ?

## 2021-08-23 NOTE — Telephone Encounter (Signed)
Patient has been advised of Pre-Admission date/time, COVID Testing date and Surgery date. ? ?Surgery Date: 08/30/21 ?Preadmission Testing Date: 08/27/21 (phone 1p-5p) ? ?Patient has been made aware to call [phone], between 1-3:00pm the day before surgery, to find out what time to arrive for surgery.   ? ?

## 2021-08-23 NOTE — Telephone Encounter (Signed)
Medical Clearance received from [facility]- -hemoglobin 7.5 on 07/31/21 -surgery not recommended until she has a hemoglobin higher than 9.5.  ?

## 2021-08-27 ENCOUNTER — Inpatient Hospital Stay: Admission: RE | Admit: 2021-08-27 | Payer: Self-pay | Source: Ambulatory Visit

## 2021-08-30 ENCOUNTER — Ambulatory Visit: Admit: 2021-08-30 | Payer: Medicaid Other | Admitting: Surgery

## 2021-08-30 SURGERY — CHOLECYSTECTOMY, ROBOT-ASSISTED, LAPAROSCOPIC
Anesthesia: General

## 2021-09-28 IMAGING — US US MFM OB FOLLOW-UP
1 series · 13 of 28 positions shown · non-contrast
Comparison: none

[Series 1: us mfm ob follow-up · 13 of 49 slices shown]
[im 2/49]
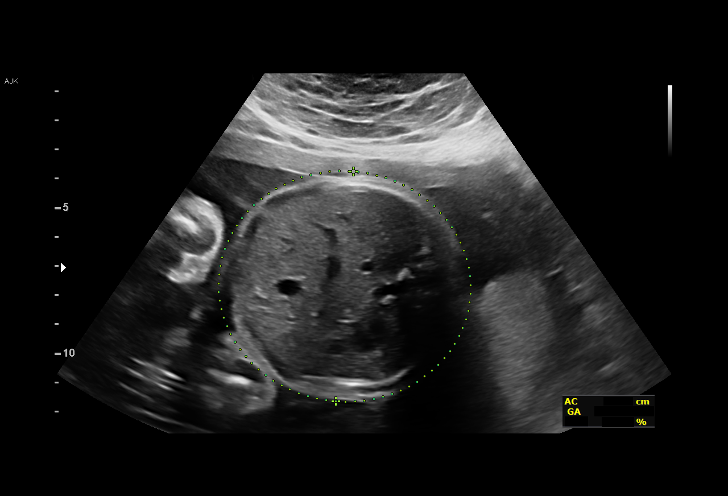
[im 6/49]
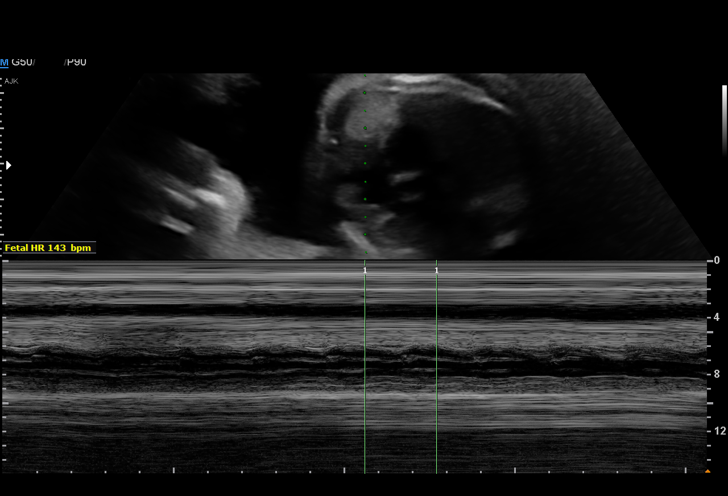
[im 9/49]
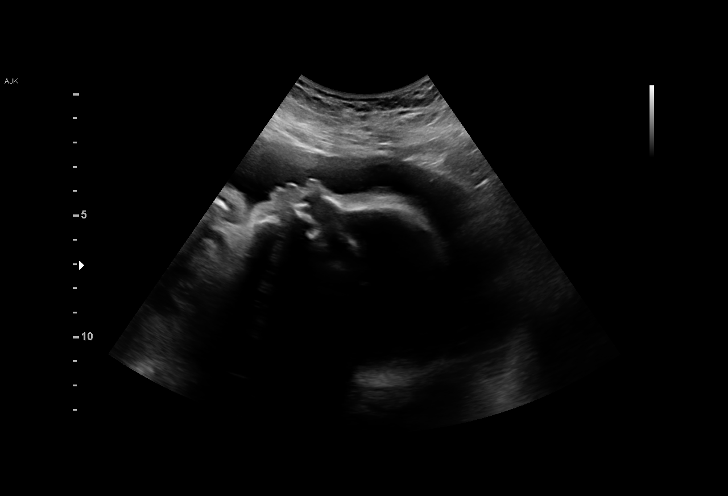
[im 13/49]
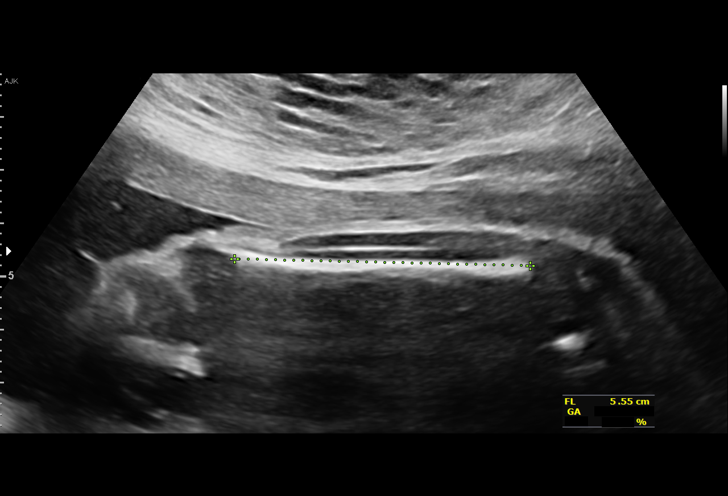
[im 17/49]
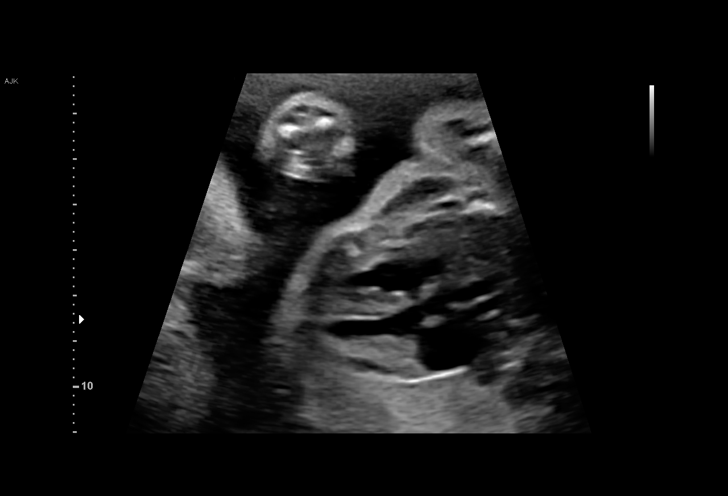
[im 20/49]
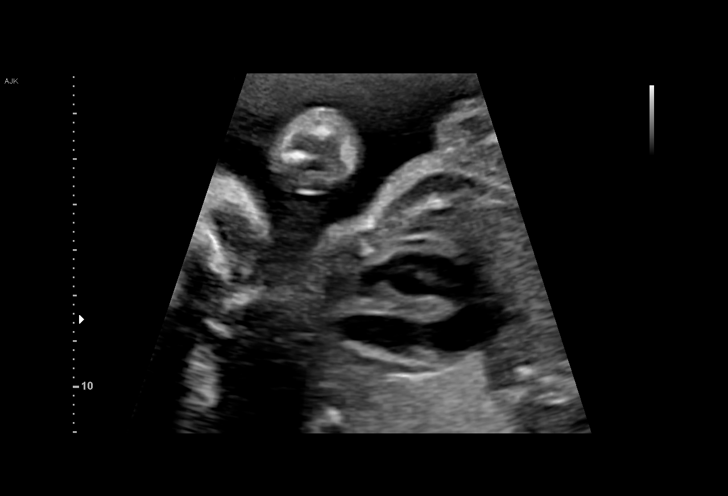
[im 25/49]
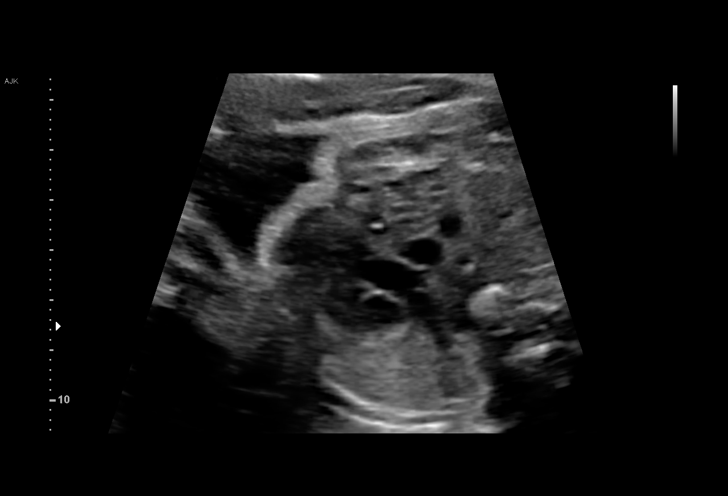
[im 29/49]
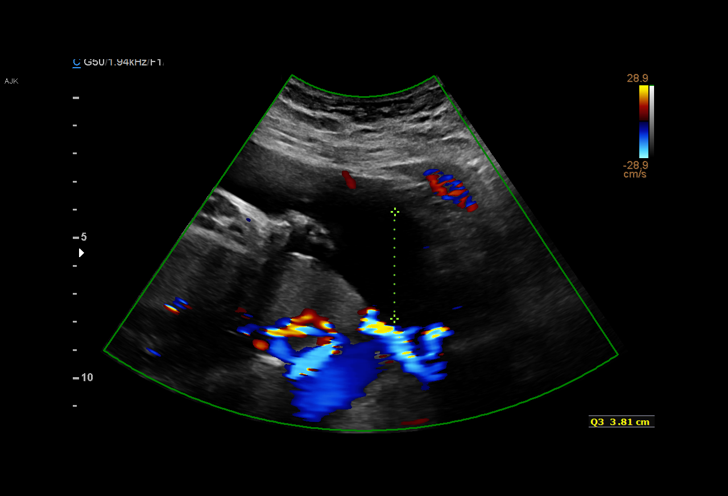
[im 33/49]
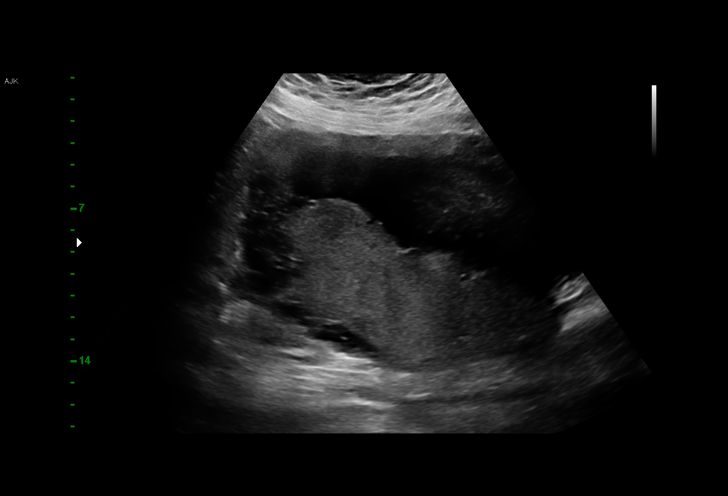
[im 36/49]
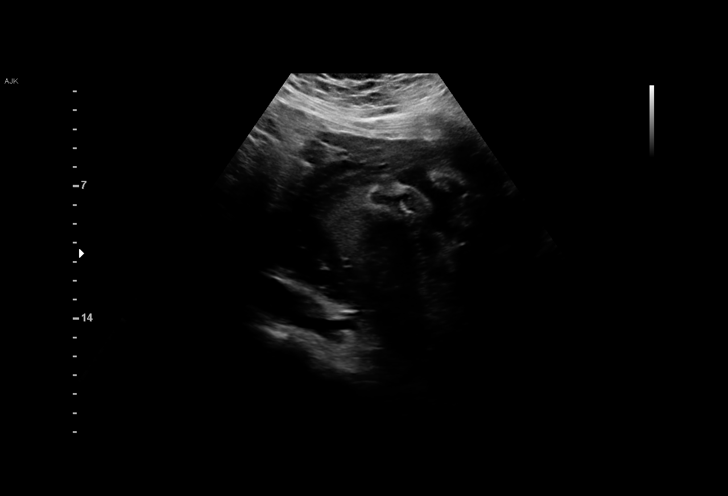
[im 40/49]
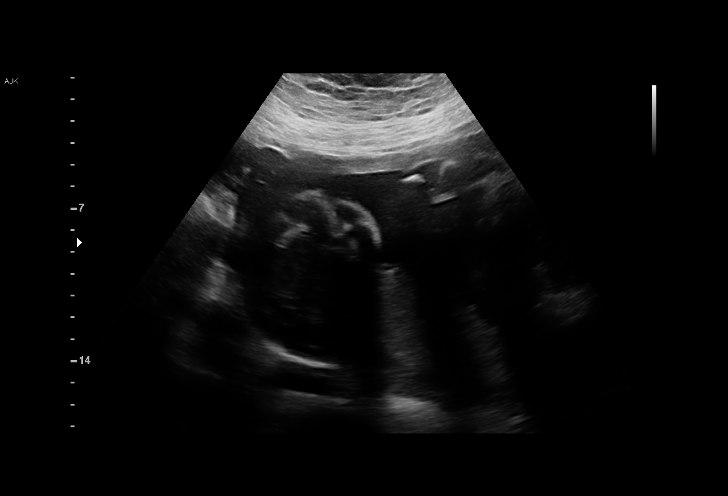
[im 43/49]
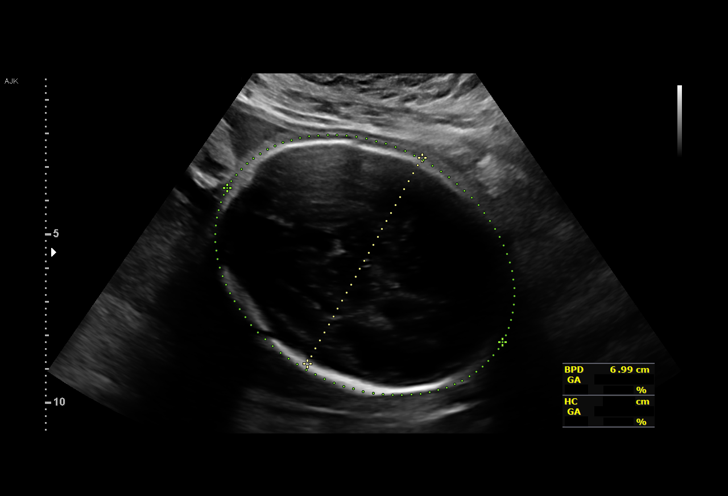
[im 47/49]
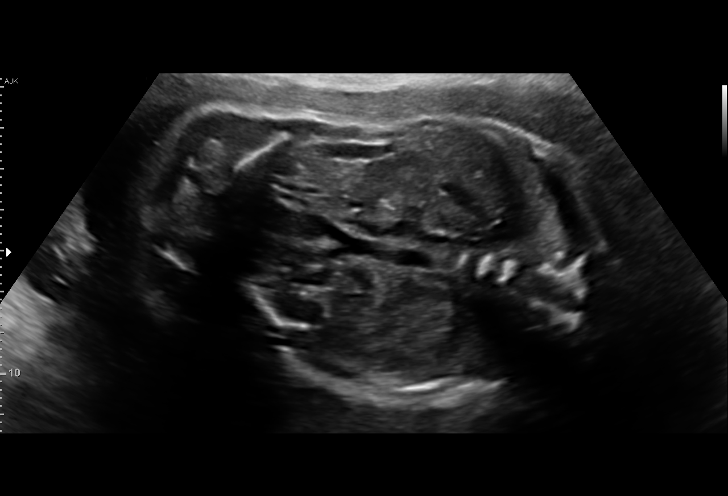

[13 of 28 positions shown; findings below may reference images not displayed]

----------------------------------------------------------------------

 ----------------------------------------------------------------------
Indications

  Poor obstetrical history (Pulmonary
  embolism - Lovenox)
  28 weeks gestation of pregnancy
  History of cesarean delivery x3 (will have
  repeat)
  Genetic carrier (Natera, Denys increased
  risk)
  Poor obstetric history: Previous preterm
  delivery (d/t pre-e)
  Poor obstetric history: Previous preeclampsia
  Maternal morbid obesity
 ----------------------------------------------------------------------
Vital Signs

                                                Height:        5'6"
Fetal Evaluation

 Num Of Fetuses:         1
 Fetal Heart Rate(bpm):  143
 Cardiac Activity:       Observed
 Presentation:           Cephalic
 Placenta:               Posterior

 Amniotic Fluid
 AFI FV:      Within normal limits

 AFI Sum(cm)     %Tile       Largest Pocket(cm)
 13.99           45

 RUQ(cm)       RLQ(cm)       LUQ(cm)        LLQ(cm)

Biometry

 BPD:      68.9  mm     G. Age:  27w 5d         14  %    CI:        72.05   %    70 - 86
                                                         FL/HC:      21.3   %    19.6 -
 HC:      258.3  mm     G. Age:  28w 1d          9  %    HC/AC:      0.99        0.99 -
 AC:      260.5  mm     G. Age:  30w 1d         87  %    FL/BPD:     79.8   %    71 - 87
 FL:         55  mm     G. Age:  29w 0d         48  %    FL/AC:      21.1   %    20 - 24

 Est. FW:    7673  gm      3 lb 1 oz     70  %
OB History

 Gravidity:    4         Term:   2        Prem:   1
 Living:       3
Gestational Age

 LMP:           28w 4d        Date:  01/10/19                 EDD:   10/17/19
 U/S Today:     28w 5d                                        EDD:   10/16/19
 Best:          28w 4d     Det. By:  LMP  (01/10/19)          EDD:   10/17/19
Anatomy

 Cranium:               Appears normal         Aortic Arch:            Previously seen
 Cavum:                 Previously seen        Ductal Arch:            Previously seen
 Ventricles:            Previously seen        Diaphragm:              Appears normal
 Choroid Plexus:        Previously seen        Stomach:                Appears normal, left
                                                                       sided
 Cerebellum:            Previously seen        Abdomen:                Appears normal
 Posterior Fossa:       Previously seen        Abdominal Wall:         Previously seen
 Nuchal Fold:           Not applicable (>20    Cord Vessels:           Previously seen
                        wks GA)
 Face:                  Orbits previously      Kidneys:                Appear normal
                        seen, profile NL
 Lips:                  Previously seen        Bladder:                Appears normal
 Thoracic:              Appears normal         Spine:                  Previously seen
 Heart:                 Appears normal         Upper Extremities:      Previously seen
                        (4CH, axis, and
                        situs)
 RVOT:                  Appears normal         Lower Extremities:      Previously seen
 LVOT:                  Appears normal

 Other:  Heels and 5th digit previously visualized. Technically difficult due to
         maternal habitus and fetal position.
Comments

 This patient was seen for a follow up growth scan due to due
 to maternal obesity.  She continues to be treated with a
 prophylactic dose of Lovenox 40 mg/day due to her history of
 a prior pulmonary embolism.  She denies any problems since
 her last exam.
 She was informed that the fetal growth and amniotic fluid
 level appears appropriate for her gestational age.
 A follow up exam was scheduled in 4 weeks.

## 2021-10-09 ENCOUNTER — Telehealth: Payer: Self-pay

## 2021-10-09 ENCOUNTER — Other Ambulatory Visit
Admission: RE | Admit: 2021-10-09 | Discharge: 2021-10-09 | Disposition: A | Discharge status: Home / Self Care | Payer: Medicaid Other | Attending: Surgery | Admitting: Surgery

## 2021-10-09 DIAGNOSIS — K8020 Calculus of gallbladder without cholecystitis without obstruction: Secondary | ICD-10-CM | POA: Diagnosis not present

## 2021-10-09 LAB — CBC WITH DIFFERENTIAL/PLATELET
Abs Immature Granulocytes: 0.01 10*3/uL (ref 0.00–0.07)
Basophils Absolute: 0 10*3/uL (ref 0.0–0.1)
Basophils Relative: 1 %
Eosinophils Absolute: 0.1 10*3/uL (ref 0.0–0.5)
Eosinophils Relative: 2 %
HCT: 27.7 % — ABNORMAL LOW (ref 36.0–46.0)
Hemoglobin: 7.9 g/dL — ABNORMAL LOW (ref 12.0–15.0)
Immature Granulocytes: 0 %
Lymphocytes Relative: 59 %
Lymphs Abs: 2.6 10*3/uL (ref 0.7–4.0)
MCH: 19.5 pg — ABNORMAL LOW (ref 26.0–34.0)
MCHC: 28.5 g/dL — ABNORMAL LOW (ref 30.0–36.0)
MCV: 68.4 fL — ABNORMAL LOW (ref 80.0–100.0)
Monocytes Absolute: 0.2 10*3/uL (ref 0.1–1.0)
Monocytes Relative: 6 %
Neutro Abs: 1.4 10*3/uL — ABNORMAL LOW (ref 1.7–7.7)
Neutrophils Relative %: 32 %
Platelets: 309 10*3/uL (ref 150–400)
RBC: 4.05 MIL/uL (ref 3.87–5.11)
RDW: 19.8 % — ABNORMAL HIGH (ref 11.5–15.5)
WBC: 4.3 10*3/uL (ref 4.0–10.5)
nRBC: 0 % (ref 0.0–0.2)

## 2021-10-09 NOTE — Telephone Encounter (Signed)
Spoke with patient -she will come to [facility] lab later today for CBC. Spoke with PCP-patient has not had hemoglobin checked since April 2023.

## 2021-10-10 ENCOUNTER — Ambulatory Visit: Payer: Medicaid Other | Admitting: Surgery

## 2021-10-11 ENCOUNTER — Other Ambulatory Visit: Payer: Self-pay

## 2021-10-11 DIAGNOSIS — D509 Iron deficiency anemia, unspecified: Secondary | ICD-10-CM

## 2021-10-11 NOTE — Progress Notes (Unsigned)
amb  

## 2021-10-12 ENCOUNTER — Other Ambulatory Visit: Payer: Self-pay

## 2021-10-19 ENCOUNTER — Inpatient Hospital Stay: Payer: Medicaid Other | Attending: Oncology | Admitting: Oncology

## 2021-10-19 ENCOUNTER — Encounter: Payer: Self-pay | Admitting: Oncology

## 2021-10-19 ENCOUNTER — Inpatient Hospital Stay: Payer: Medicaid Other

## 2021-12-11 ENCOUNTER — Inpatient Hospital Stay: Payer: Medicaid Other | Attending: Oncology | Admitting: Oncology

## 2021-12-11 ENCOUNTER — Inpatient Hospital Stay: Payer: Medicaid Other

## 2022-03-18 IMAGING — CR DG CHEST 2V
1 series · 2 of 2 positions shown · non-contrast
Comparison: None.

CLINICAL DATA: Cough and fever with recent COVID exposure

EXAM:
CHEST - 2 VIEW

[Series 5: w chest pa · 0.14mm/px · 2 of 2 slices shown]
[im 1/2]
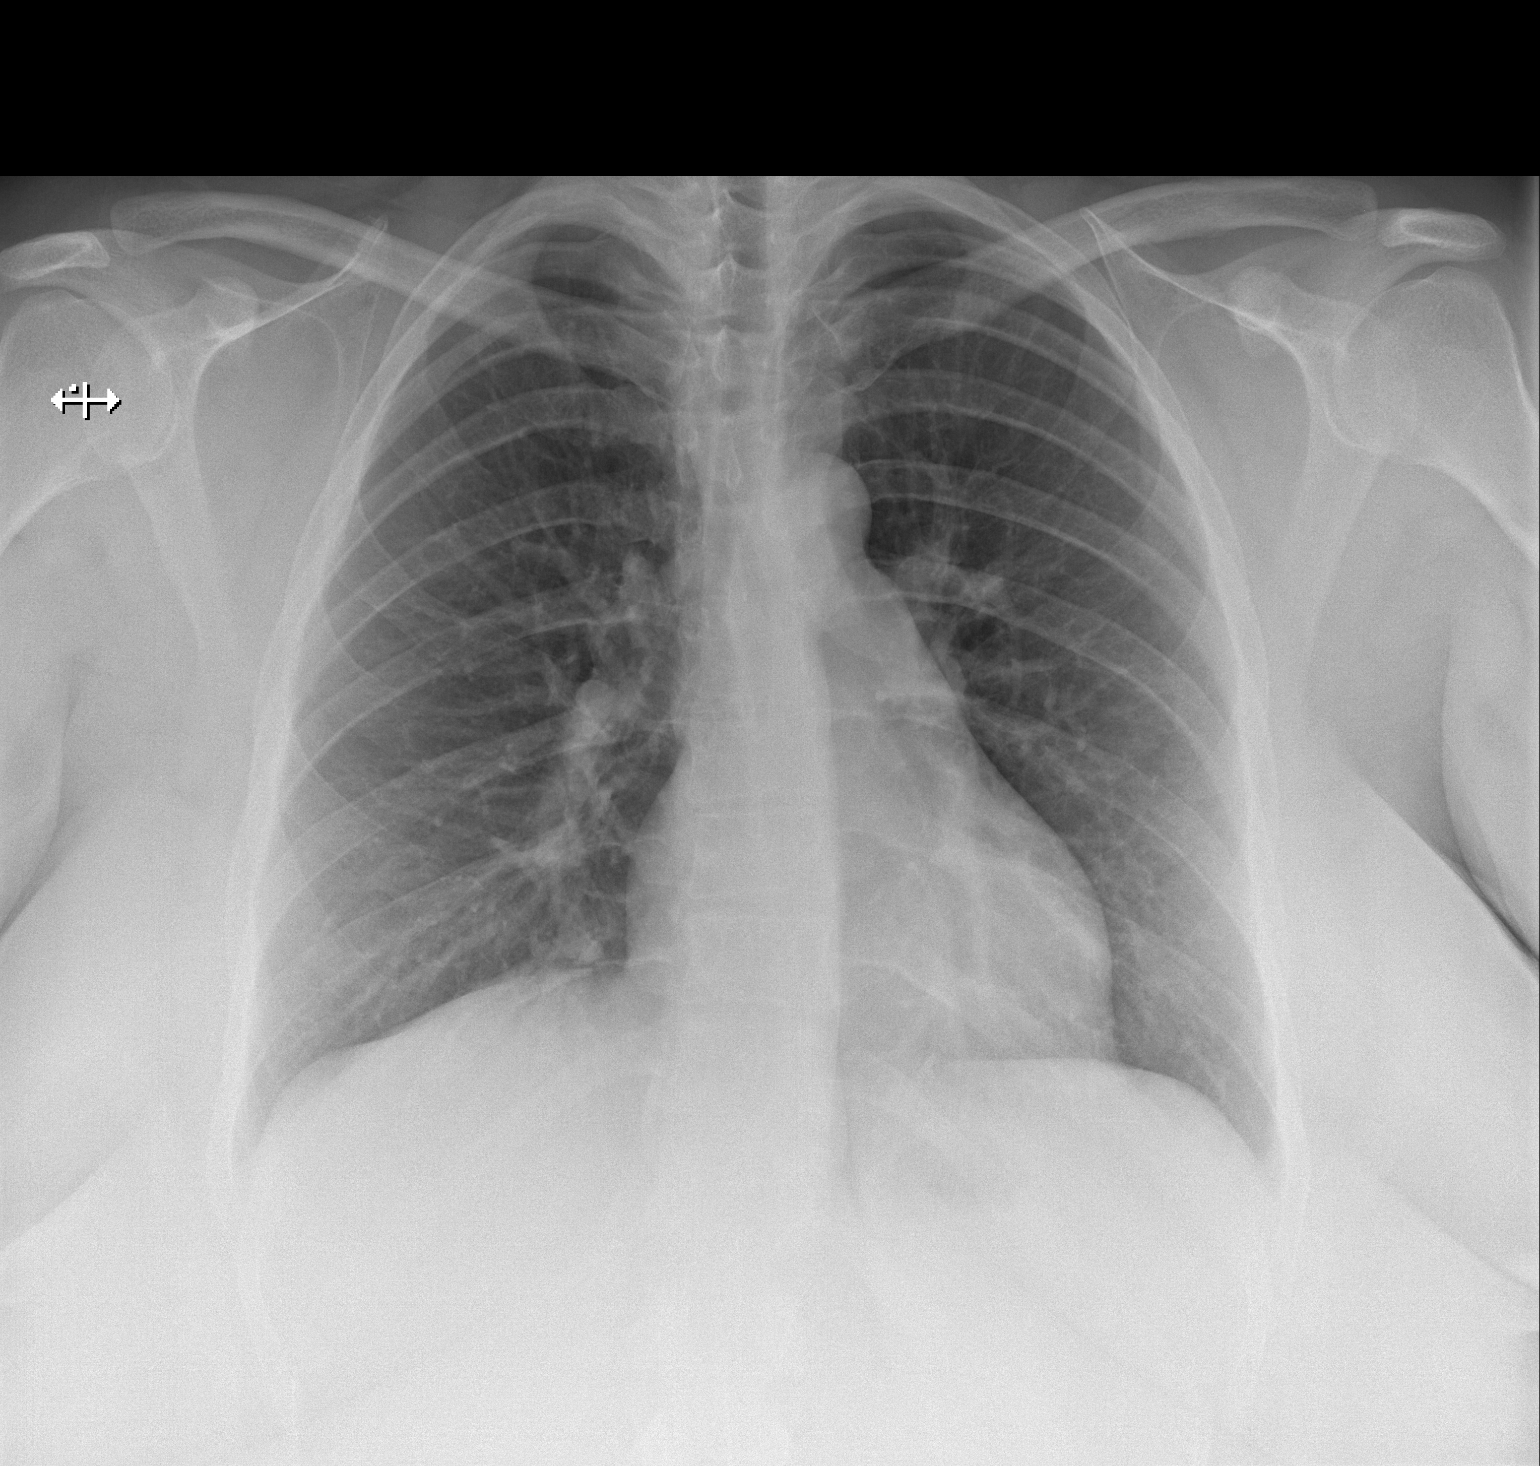
[im 2/2]
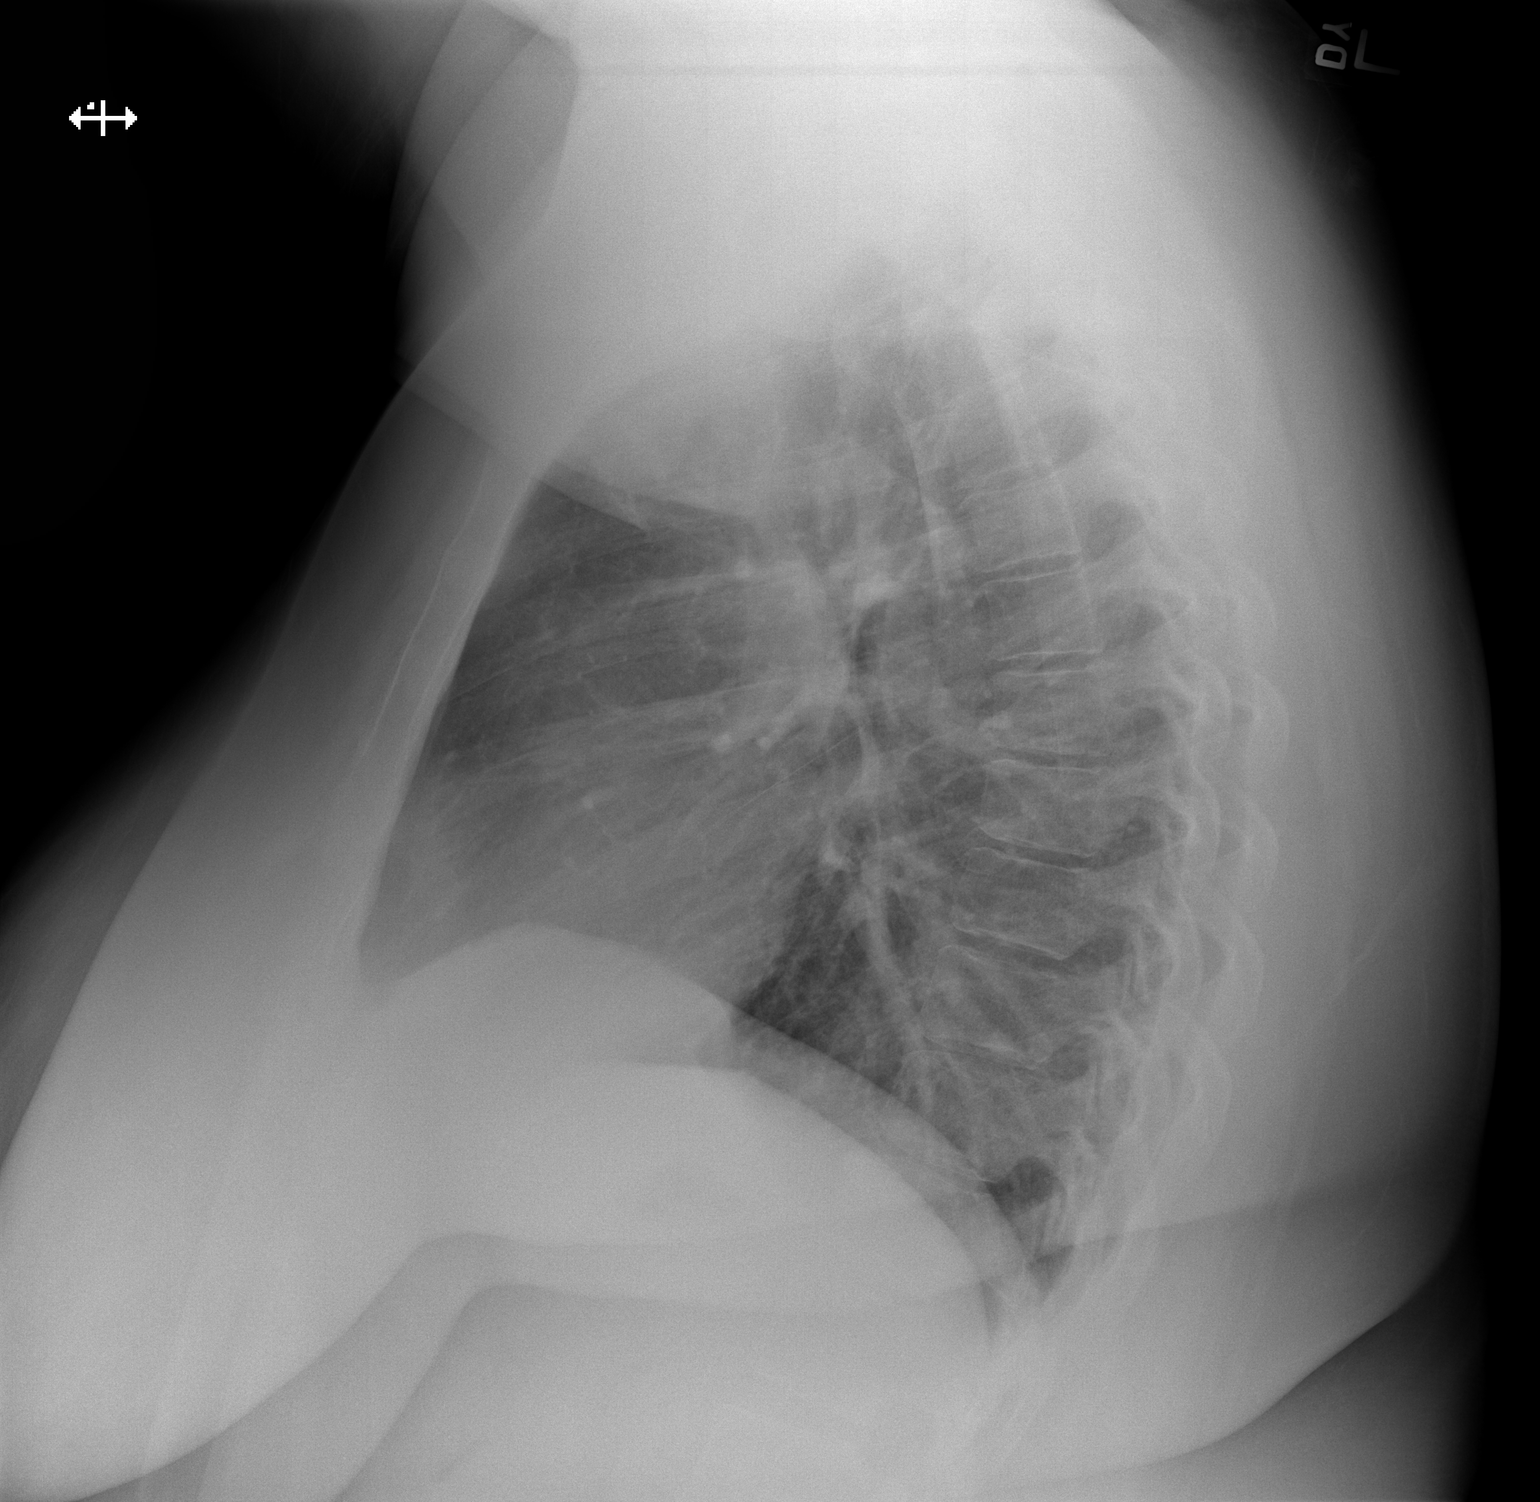

[2 of 2 positions shown; findings below may reference images not displayed]

FINDINGS: The heart size and mediastinal contours are within normal limits.
Both lungs are clear. The visualized skeletal structures are
unremarkable.
IMPRESSION: No active cardiopulmonary disease.

## 2022-10-07 ENCOUNTER — Ambulatory Visit (INDEPENDENT_AMBULATORY_CARE_PROVIDER_SITE_OTHER): Payer: No Typology Code available for payment source | Admitting: Internal Medicine

## 2022-10-07 ENCOUNTER — Encounter: Payer: Self-pay | Admitting: Internal Medicine

## 2022-10-07 VITALS — BP 130/76 | HR 80 | Ht 68.0 in | Wt 265.4 lb

## 2022-10-07 DIAGNOSIS — K8012 Calculus of gallbladder with acute and chronic cholecystitis without obstruction: Secondary | ICD-10-CM | POA: Diagnosis not present

## 2022-10-07 DIAGNOSIS — R1011 Right upper quadrant pain: Secondary | ICD-10-CM | POA: Insufficient documentation

## 2022-10-07 DIAGNOSIS — R110 Nausea: Secondary | ICD-10-CM | POA: Insufficient documentation

## 2022-10-07 MED ORDER — ONDANSETRON HCL 4 MG PO TABS: 4.0000 mg | ORAL_TABLET | Freq: Three times a day (TID) | ORAL | 0 refills | Status: AC | PRN

## 2022-10-07 NOTE — Progress Notes (Signed)
Established Patient Office Visit  Subjective:  Patient ID: Sophia Olson, female    DOB: 1990/05/07  Age: 32 y.o. MRN: [id number]  Chief Complaint  Patient presents with   Abdominal Pain    Patient not in since April of 2023. Has history of gall stones - and was scheduled for surgery last year, but got cancelled. Patient continued  to have pain, nausea, and vomiting but could not reschedule it for various reasons. However now feels worse, with excessive nausea, abdominal pain , poor appetite and weight loss. Wants to get referred back to same surgeon. No fever or chills, no diarrhea or constipation.   Abdominal Pain This is a chronic problem. The current episode started more than 1 month ago. The onset quality is gradual. The problem occurs constantly. The problem has been gradually worsening. The pain is located in the RUQ. The quality of the pain is colicky. The abdominal pain does not radiate. Associated symptoms include nausea, vomiting and weight loss. Pertinent negatives include no constipation, diarrhea, dysuria, fever, frequency, headaches, hematuria or myalgias. The pain is aggravated by eating. Her past medical history is significant for gallstones.    No other concerns at this time.   Past Medical History:  Diagnosis Date   Blood transfusion without reported diagnosis    History of postpartum hemorrhage    History of preterm delivery, currently pregnant    History of seizures    as a child; unknown cause   Preterm labor    Seizures (HCC)    Thromboembolism during pregnancy     Past Surgical History:  Procedure Laterality Date   CESAREAN SECTION     x3   CESAREAN SECTION     CESAREAN SECTION     CESAREAN SECTION WITH BILATERAL TUBAL LIGATION Bilateral 10/14/2019   Procedure: CESAREAN SECTION WITH BILATERAL TUBAL LIGATION;  Surgeon: Catalina Antigua, MD;  Location: [facility];  Service: Obstetrics;  Laterality: Bilateral;   WISDOM TOOTH EXTRACTION      Social  History   Socioeconomic History   Marital status: Single    Spouse name: Not on file   Number of children: Not on file   Years of education: Not on file   Highest education level: Not on file  Occupational History   Not on file  Tobacco Use   Smoking status: Never   Smokeless tobacco: Never  Vaping Use   Vaping Use: Never used  Substance and Sexual Activity   Alcohol use: No   Drug use: No   Sexual activity: Yes    Birth control/protection: None  Other Topics Concern   Not on file  Social History Narrative   Not on file   Social Determinants of Health   Financial Resource Strain: Not on file  Food Insecurity: No Food Insecurity (09/01/2019)   Hunger Vital Sign    Worried About Running Out of Food in the Last Year: Never true    Ran Out of Food in the Last Year: Never true  Transportation Needs: No Transportation Needs (09/01/2019)   PRAPARE - Administrator, Civil Service (Medical): No    Lack of Transportation (Non-Medical): No  Physical Activity: Not on file  Stress: Not on file  Social Connections: Not on file  Intimate Partner Violence: Not on file    History reviewed. No pertinent family history.  No Known Allergies  Review of Systems  Constitutional:  Positive for malaise/fatigue and weight loss. Negative for chills, diaphoresis and fever.  HENT: Negative.    Eyes: Negative.   Respiratory:  Negative for cough, hemoptysis, sputum production and wheezing.   Cardiovascular:  Negative for chest pain, palpitations, orthopnea, claudication and leg swelling.  Gastrointestinal:  Positive for abdominal pain, nausea and vomiting. Negative for blood in stool, constipation and diarrhea.  Genitourinary:  Negative for dysuria, flank pain, frequency, hematuria and urgency.  Musculoskeletal:  Negative for back pain, joint pain, myalgias and neck pain.  Neurological:  Positive for speech change. Negative for dizziness, tingling, tremors, focal weakness, weakness  and headaches.  Psychiatric/Behavioral:  Negative for depression, memory loss, substance abuse and suicidal ideas. The patient is not nervous/anxious.        Objective:   BP 130/76   Pulse 80   Ht 5\' 8"  (1.727 m)   Wt 265 lb 6.4 oz (120.4 kg)   SpO2 99%   BMI 40.35 kg/m   Vitals:   10/07/22 1532  BP: 130/76  Pulse: 80  Height: 5\' 8"  (1.727 m)  Weight: 265 lb 6.4 oz (120.4 kg)  SpO2: 99%  BMI (Calculated): 40.36    Physical Exam Vitals and nursing note reviewed.  Constitutional:      Appearance: She is well-developed. She is obese.  Cardiovascular:     Rate and Rhythm: Normal rate.     Heart sounds: Normal heart sounds.  Pulmonary:     Effort: Pulmonary effort is normal.     Breath sounds: No rhonchi or rales.  Chest:     Chest wall: No tenderness.  Abdominal:     General: Bowel sounds are normal. There is no distension. There are no signs of injury.     Palpations: Abdomen is soft. There is no shifting dullness, fluid wave, splenomegaly, mass or pulsatile mass.     Tenderness: There is abdominal tenderness in the right upper quadrant. There is no right CVA tenderness, left CVA tenderness or guarding.     Hernia: No hernia is present.  Neurological:     General: No focal deficit present.     Mental Status: She is alert.  Psychiatric:        Mood and Affect: Mood normal.        Behavior: Behavior normal.      No results found for any visits on 10/07/22.  No results found for this or any previous visit (from the past 2160 hour(s)).    Assessment & Plan:  Check Labs today. Zofran for nausea. Surgical consult . Patient advised to go to ED if pain gets worse suddenly. Problem List Items Addressed This Visit     Obesity, morbid, BMI 50 or higher (HCC)   Calculus of gallbladder with acute on chronic cholecystitis without obstruction   Relevant Orders   Ambulatory referral to General Surgery   Right upper quadrant abdominal pain - Primary   Relevant Orders    CBC With Differential   CMP14+EGFR   Lipase   Ambulatory referral to General Surgery   Nausea   Relevant Medications   ondansetron (ZOFRAN) 4 MG tablet   Other Relevant Orders   CBC With Differential   CMP14+EGFR   Lipase    Return in about 2 weeks (around 10/21/2022).   Total time spent: 30 minutes  Margaretann Loveless, MD  10/07/2022   This document may have been prepared by [facility] Voice Recognition software and as such may include unintentional dictation errors.

## 2022-10-08 ENCOUNTER — Other Ambulatory Visit: Payer: Self-pay

## 2022-10-08 ENCOUNTER — Other Ambulatory Visit: Payer: Self-pay | Admitting: Internal Medicine

## 2022-10-08 DIAGNOSIS — D500 Iron deficiency anemia secondary to blood loss (chronic): Secondary | ICD-10-CM

## 2022-10-08 DIAGNOSIS — N921 Excessive and frequent menstruation with irregular cycle: Secondary | ICD-10-CM

## 2022-10-08 LAB — LIPASE: Lipase: 19 U/L (ref 14–72)

## 2022-10-08 LAB — CMP14+EGFR
ALT: 10 IU/L (ref 0–32)
AST: 12 IU/L (ref 0–40)
Albumin: 4 g/dL (ref 3.9–4.9)
Alkaline Phosphatase: 86 IU/L (ref 44–121)
BUN/Creatinine Ratio: 17 (ref 9–23)
BUN: 14 mg/dL (ref 6–20)
Bilirubin Total: <0.2 mg/dL (ref 0.0–1.2)
CO2: 23 mmol/L (ref 20–29)
Calcium: 9 mg/dL (ref 8.7–10.2)
Chloride: 105 mmol/L (ref 96–106)
Creatinine, Ser: 0.83 mg/dL (ref 0.57–1.00)
Globulin, Total: 2.7 g/dL (ref 1.5–4.5)
Glucose: 74 mg/dL (ref 70–99)
Potassium: 4.1 mmol/L (ref 3.5–5.2)
Sodium: 140 mmol/L (ref 134–144)
Total Protein: 6.7 g/dL (ref 6.0–8.5)
eGFR: 96 mL/min/{1.73_m2} (ref >59)

## 2022-10-08 LAB — CBC WITH DIFFERENTIAL
Basophils Absolute: 0 10*3/uL (ref 0.0–0.2)
Basos: 1 %
EOS (ABSOLUTE): 0.1 10*3/uL (ref 0.0–0.4)
Eos: 1 %
Hematocrit: 24.5 % — ABNORMAL LOW (ref 34.0–46.6)
Hemoglobin: 6.6 g/dL — CL (ref 11.1–15.9)
Immature Grans (Abs): 0 10*3/uL (ref 0.0–0.1)
Immature Granulocytes: 0 %
Lymphocytes Absolute: 2.3 10*3/uL (ref 0.7–3.1)
Lymphs: 54 %
MCH: 17.4 pg — ABNORMAL LOW (ref 26.6–33.0)
MCHC: 26.9 g/dL — ABNORMAL LOW (ref 31.5–35.7)
MCV: 65 fL — ABNORMAL LOW (ref 79–97)
Monocytes Absolute: 0.2 10*3/uL (ref 0.1–0.9)
Monocytes: 5 %
Neutrophils Absolute: 1.6 10*3/uL (ref 1.4–7.0)
Neutrophils: 39 %
RBC: 3.8 x10E6/uL (ref 3.77–5.28)
RDW: 17.5 % — ABNORMAL HIGH (ref 11.7–15.4)
WBC: 4.2 10*3/uL (ref 3.4–10.8)

## 2022-10-08 MED ORDER — FERROUS SULFATE 27 MG PO TABS: 27.0000 mg | ORAL_TABLET | Freq: Two times a day (BID) | ORAL | 3 refills | Status: AC

## 2022-10-09 LAB — IRON AND TIBC
Iron Saturation: 3 % — CL (ref 15–55)
Iron: 12 ug/dL — ABNORMAL LOW (ref 27–159)
Total Iron Binding Capacity: 393 ug/dL (ref 250–450)
UIBC: 381 ug/dL (ref 131–425)

## 2022-10-09 LAB — SPECIMEN STATUS REPORT

## 2022-10-09 LAB — FERRITIN: Ferritin: 3 ng/mL — ABNORMAL LOW (ref 15–150)

## 2022-10-10 NOTE — Progress Notes (Signed)
Patient notified

## 2022-10-14 ENCOUNTER — Encounter: Payer: Self-pay | Admitting: Surgery

## 2022-10-14 ENCOUNTER — Ambulatory Visit (INDEPENDENT_AMBULATORY_CARE_PROVIDER_SITE_OTHER): Payer: No Typology Code available for payment source | Admitting: Surgery

## 2022-10-14 VITALS — BP 107/70 | HR 75 | Temp 98.0°F | Ht 68.0 in | Wt 263.0 lb

## 2022-10-14 DIAGNOSIS — K8020 Calculus of gallbladder without cholecystitis without obstruction: Secondary | ICD-10-CM | POA: Diagnosis not present

## 2022-10-14 NOTE — Patient Instructions (Addendum)
We will have you follow up here in about 6 weeks. If you need more or less time please call us to reschedule you appointment with Dr Lannette Donath will most likely be out of work 1-2 weeks for this surgery.  If you have FMLA or disability paperwork that needs filled out you may drop this off at our office or this can be faxed to [phone].  You will return after your post-op appointment with a lifting restriction of 15-20 pounds for approximately 4 more weeks.  You will be able to eat anything you would like to following surgery. But, start by eating a bland diet and advance this as tolerated. The Gallbladder diet is below, please go as closely by this diet as possible prior to surgery to avoid any further attacks.  If you have any questions, please call our office.  Laparoscopic Cholecystectomy Laparoscopic cholecystectomy is surgery to remove the gallbladder. The gallbladder is located in the upper right part of the abdomen, behind the liver. It is a storage sac for bile, which is produced in the liver. Bile aids in the digestion and absorption of fats. Cholecystectomy is often done for inflammation of the gallbladder (cholecystitis). This condition is usually caused by a buildup of gallstones (cholelithiasis) in the gallbladder. Gallstones can block the flow of bile, and that can result in inflammation and pain. In severe cases, emergency surgery may be required. If emergency surgery is not required, you will have time to prepare for the procedure. Laparoscopic surgery is an alternative to open surgery. Laparoscopic surgery has a shorter recovery time. Your common bile duct may also need to be examined during the procedure. If stones are found in the common bile duct, they may be removed. LET [facility] CARE PROVIDER KNOW ABOUT: Any allergies you have. All medicines you are taking, including vitamins, herbs, eye drops, creams, and over-the-counter medicines. Previous problems you or  members of your family have had with the use of anesthetics. Any blood disorders you have. Previous surgeries you have had.  Any medical conditions you have. RISKS AND COMPLICATIONS Generally, this is a safe procedure. However, problems may occur, including: Infection. Bleeding. Allergic reactions to medicines. Damage to other structures or organs. A stone remaining in the common bile duct. A bile leak from the cyst duct that is clipped when your gallbladder is removed. The need to convert to open surgery, which requires a larger incision in the abdomen. This may be necessary if your surgeon thinks that it is not safe to continue with a laparoscopic procedure. BEFORE THE PROCEDURE Ask your health care provider about: Changing or stopping your regular medicines. This is especially important if you are taking diabetes medicines or blood thinners. Taking medicines such as aspirin and ibuprofen. These medicines can thin your blood. Do not take these medicines before your procedure if your health care provider instructs you not to. Follow instructions from your health care provider about eating or drinking restrictions. Let your health care provider know if you develop a cold or an infection before surgery. Plan to have someone take you home after the procedure. Ask your health care provider how your surgical site will be marked or identified. You may be given antibiotic medicine to help prevent infection. PROCEDURE To reduce your risk of infection: Your health care team will wash or sanitize their hands. Your skin will be washed with soap. An IV tube may be inserted into one of your veins. You will be given a  medicine to make you fall asleep (general anesthetic). A breathing tube will be placed in your mouth. The surgeon will make several small cuts (incisions) in your abdomen. A thin, lighted tube (laparoscope) that has a tiny camera on the end will be inserted through one of the small  incisions. The camera on the laparoscope will send a picture to a TV screen (monitor) in the operating room. This will give the surgeon a good view inside your abdomen. A gas will be pumped into your abdomen. This will expand your abdomen to give the surgeon more room to perform the surgery. Other tools that are needed for the procedure will be inserted through the other incisions. The gallbladder will be removed through one of the incisions. After your gallbladder has been removed, the incisions will be closed with stitches (sutures), staples, or skin glue. Your incisions may be covered with a bandage (dressing). The procedure may vary among health care providers and hospitals. AFTER THE PROCEDURE Your blood pressure, heart rate, breathing rate, and blood oxygen level will be monitored often until the medicines you were given have worn off. You will be given medicines as needed to control your pain.   This information is not intended to replace advice given to you by your health care provider. Make sure you discuss any questions you have with your health care provider.   Document Released: 04/08/2005 Document Revised: 12/28/2014 Document Reviewed: 11/18/2012 Elsevier Interactive Patient Education 2016 Elsevier Inc.   Low-Fat Diet for Gallbladder Conditions A low-fat diet can be helpful if you have pancreatitis or a gallbladder condition. With these conditions, your pancreas and gallbladder have trouble digesting fats. A healthy eating plan with less fat will help rest your pancreas and gallbladder and reduce your symptoms. WHAT DO I NEED TO KNOW ABOUT THIS DIET? Eat a low-fat diet. Reduce your fat intake to less than 20-30% of your total daily calories. This is less than 50-60 g of fat per day. Remember that you need some fat in your diet. Ask your dietician what your daily goal should be. Choose nonfat and low-fat healthy foods. Look for the words "nonfat," "low fat," or "fat free." As a  guide, look on the label and choose foods with less than 3 g of fat per serving. Eat only one serving. Avoid alcohol. Do not smoke. If you need help quitting, talk with your health care provider. Eat small frequent meals instead of three large heavy meals. WHAT FOODS CAN I EAT? Grains Include healthy grains and starches such as potatoes, wheat bread, fiber-rich cereal, and brown rice. Choose whole grain options whenever possible. In adults, whole grains should account for 45-65% of your daily calories.  Fruits and Vegetables Eat plenty of fruits and vegetables. Fresh fruits and vegetables add fiber to your diet. Meats and Other Protein Sources Eat lean meat such as chicken and pork. Trim any fat off of meat before cooking it. Eggs, fish, and beans are other sources of protein. In adults, these foods should account for 10-35% of your daily calories. Dairy Choose low-fat milk and dairy options. Dairy includes fat and protein, as well as calcium.  Fats and Oils Limit high-fat foods such as fried foods, sweets, baked goods, sugary drinks.  Other Creamy sauces and condiments, such as mayonnaise, can add extra fat. Think about whether or not you need to use them, or use smaller amounts or low fat options. WHAT FOODS ARE NOT RECOMMENDED? High fat foods, such as: [employer]. Ice cream.  [place] toast. Sweet rolls. Pizza. Cheese bread. Foods covered with batter, butter, creamy sauces, or cheese. Fried foods. Sugary drinks and desserts. Foods that cause gas or bloating   This information is not intended to replace advice given to you by your health care provider. Make sure you discuss any questions you have with your health care provider.   Document Released: 04/13/2013 Document Reviewed: 04/13/2013 Elsevier Interactive Patient Education [employer]

## 2022-10-14 NOTE — Progress Notes (Unsigned)
10/14/2022  History of Present Illness: Sophia Olson is a 32 y.o. female with symptomatic cholelithiasis presenting for follow up.  She was last seen on 08/22/21 for this and we had plans for robotic cholecystectomy.  However, her hemoglobin was low to 7.5 and she was not cleared for surgery.  I referred her to Hematology back then but she did not show up to appointments.  She reports she had other issues ongoing that she had to prioritize.  She has been trying a low fat diet and this has been working relatively well, but she's still having symptoms.  She presents for discuss scheduling surgery.  However, her PCP obtained new labs on 10/07/22 which showed a low hemoglobin of 6.6 (repeat 6.8 on 10/15/22), low iron of 12, ferritin 3, and iron saturation 3.  New referral was placed to Hematology and she has an appointment with Dr. Cathie Hoops tomorrow.  Past Medical History: Past Medical History:  Diagnosis Date   Blood transfusion without reported diagnosis    History of postpartum hemorrhage    History of preterm delivery, currently pregnant    History of seizures    as a child; unknown cause   Preterm labor    Seizures (HCC)    Thromboembolism during pregnancy      Past Surgical History: Past Surgical History:  Procedure Laterality Date   CESAREAN SECTION     x3   CESAREAN SECTION     CESAREAN SECTION     CESAREAN SECTION WITH BILATERAL TUBAL LIGATION Bilateral 10/14/2019   Procedure: CESAREAN SECTION WITH BILATERAL TUBAL LIGATION;  Surgeon: Catalina Antigua, MD;  Location: [facility] ORS;  Service: Obstetrics;  Laterality: Bilateral;   WISDOM TOOTH EXTRACTION      Home Medications: Prior to Admission medications   Medication Sig Start Date End Date Taking? Authorizing Provider  CALCIUM-VITAMIN D PO Take 1 tablet by mouth daily.   Yes [provider]  cholecalciferol (VITAMIN D3) 25 MCG (1000 UNIT) tablet Take 1,000 Units by mouth daily.   Yes [provider]  Ferrous Sulfate  27 MG TABS Take 1 tablet (27 mg total) by mouth 2 (two) times daily. 10/08/22  Yes Margaretann Loveless, MD  ondansetron (ZOFRAN) 4 MG tablet Take 1 tablet (4 mg total) by mouth every 8 (eight) hours as needed for nausea or vomiting. 10/07/22  Yes Margaretann Loveless, MD    Allergies: No Known Allergies  Review of Systems: Review of Systems  Constitutional:  Positive for malaise/fatigue. Negative for chills and fever.  Respiratory:  Negative for shortness of breath.   Cardiovascular:  Negative for chest pain.  Gastrointestinal:  Positive for abdominal pain, nausea and vomiting.  Genitourinary:  Negative for dysuria.    Physical Exam BP 107/70   Pulse 75   Temp 98 F (36.7 C)   Ht 5\' 8"  (1.727 m)   Wt 263 lb (119.3 kg)   LMP 10/07/2022 (Exact Date)   SpO2 98%   BMI 39.99 kg/m  CONSTITUTIONAL: No acute distress HEENT:  Normocephalic, atraumatic, extraocular motion intact. RESPIRATORY:  Normal respiratory effort without pathologic use of accessory muscles. CARDIOVASCULAR: Regular rhythm and rate. GI: The abdomen is soft, non-distended, with some discomfort in epigastric and RUQ region, but negative Murphy's sign.  NEUROLOGIC:  Motor and sensation is grossly normal.  Cranial nerves are grossly intact. PSYCH:  Alert and oriented to person, place and time. Affect is normal.  Labs/Imaging: Labs from 10/07/22: Na 140, K 4.1, Cl 105, CO2 23, BUN  14, Cr 0.83.  Total bili < 0.2, AST 12, ALT 10, Alk Phos 86, albumin 4.  Iron 12, Ferritin 3, iron saturation 3.  WBC 4.2, Hgb 6.6, Hct 24.5.  Assessment and Plan: This is a 32 y.o. female with symptomatic cholelithiasis.  --Discussed with the patient that now her Hgb is even lower than last time, down to 6.6.  It would be highly unlikely that we would be able to obtain clearance from PCP and also that the anesthesia team would be in agreement with elective surgery given such degree of anemia.  I think this needs to improve before we can do any elective  surgery for her. --Patient has an appointment with Dr. Cathie Hoops tomorrow.  Highly encouraged the patient to keep the appointment.  I assume she'll need at least IV iron infusions.  We'll tentatively set up a follow up appointment in about 6 weeks to see how her progress is doing and if we can schedule surgery.  She will also keep [place] posted if Dr. Cathie Hoops gives her a timeline so we can adjust the follow up appointment as needed. --For now, continue strict low fat diet.  Return precautions given.  I spent 20 minutes dedicated to the care of this patient on the date of this encounter to include pre-visit review of records, face-to-face time with the patient discussing diagnosis and management, and any post-visit coordination of care.   Howie Ill, MD [facility]

## 2022-10-15 ENCOUNTER — Inpatient Hospital Stay: Payer: No Typology Code available for payment source

## 2022-10-15 ENCOUNTER — Inpatient Hospital Stay: Payer: No Typology Code available for payment source | Attending: Oncology | Admitting: Oncology

## 2022-10-15 ENCOUNTER — Encounter: Payer: Self-pay | Admitting: Oncology

## 2022-10-15 ENCOUNTER — Other Ambulatory Visit: Payer: Self-pay | Admitting: Oncology

## 2022-10-15 VITALS — BP 112/64 | HR 84 | Temp 97.2°F | Resp 18 | Wt 267.9 lb

## 2022-10-15 DIAGNOSIS — D500 Iron deficiency anemia secondary to blood loss (chronic): Secondary | ICD-10-CM

## 2022-10-15 DIAGNOSIS — N920 Excessive and frequent menstruation with regular cycle: Secondary | ICD-10-CM | POA: Insufficient documentation

## 2022-10-15 DIAGNOSIS — D509 Iron deficiency anemia, unspecified: Secondary | ICD-10-CM | POA: Insufficient documentation

## 2022-10-15 LAB — CBC WITH DIFFERENTIAL (CANCER CENTER ONLY)
Abs Immature Granulocytes: 0.02 10*3/uL (ref 0.00–0.07)
Basophils Absolute: 0 10*3/uL (ref 0.0–0.1)
Basophils Relative: 0 %
Eosinophils Absolute: 0.1 10*3/uL (ref 0.0–0.5)
Eosinophils Relative: 1 %
HCT: 24.6 % — ABNORMAL LOW (ref 36.0–46.0)
Hemoglobin: 6.8 g/dL — CL (ref 12.0–15.0)
Immature Granulocytes: 0 %
Lymphocytes Relative: 47 %
Lymphs Abs: 2.8 10*3/uL (ref 0.7–4.0)
MCH: 17.8 pg — ABNORMAL LOW (ref 26.0–34.0)
MCHC: 27.6 g/dL — ABNORMAL LOW (ref 30.0–36.0)
MCV: 64.6 fL — ABNORMAL LOW (ref 80.0–100.0)
Monocytes Absolute: 0.3 10*3/uL (ref 0.1–1.0)
Monocytes Relative: 5 %
Neutro Abs: 2.8 10*3/uL (ref 1.7–7.7)
Neutrophils Relative %: 47 %
Platelet Count: 394 10*3/uL (ref 150–400)
RBC: 3.81 MIL/uL — ABNORMAL LOW (ref 3.87–5.11)
RDW: 19.8 % — ABNORMAL HIGH (ref 11.5–15.5)
Smear Review: ADEQUATE
WBC Count: 5.9 10*3/uL (ref 4.0–10.5)
nRBC: 0 % (ref 0.0–0.2)

## 2022-10-15 LAB — SAMPLE TO BLOOD BANK

## 2022-10-15 LAB — ABO/RH: ABO/RH(D): O POS

## 2022-10-15 NOTE — Assessment & Plan Note (Signed)
Repeat CBC testing showed hemoglobin 6.8.  I recommend 1 unit of PRBC transfusion.  Patient declines going to emergency room for blood transfusion and would like to have outpatient management.  Will arrange patient to get transfusion on 10/17/2022. After that I recommend IV Venofer treatments weekly x 5. I discussed about the potential risks including but not limited to allergic reactions/infusion reactions including anaphylactic reactions, phlebitis, high blood pressure, wheezing, SOB, skin rash, weight gain,dark urine, leg swelling, back pain, headache, nausea and fatigue, etc. Patient agrees with the plan. She has history of tubal ligation-no chance of pregnancy.

## 2022-10-15 NOTE — Progress Notes (Signed)
Hematology/Oncology Consult note [phone] [phone]      Patient Care Team: [name], NP as PCP - General (Nurse Practitioner)   REFERRING PROVIDER: Margaretann Loveless, MD  CHIEF COMPLAINTS/REASON FOR VISIT:  Anemia  ASSESSMENT & PLAN:  IDA (iron deficiency anemia) Repeat CBC testing showed hemoglobin 6.8.  I recommend 1 unit of PRBC transfusion.  Patient declines going to emergency room for blood transfusion and would like to have outpatient management.  Will arrange patient to get transfusion on 10/17/2022. After that I recommend IV Venofer treatments weekly x 5. I discussed about the potential risks including but not limited to allergic reactions/infusion reactions including anaphylactic reactions, phlebitis, high blood pressure, wheezing, SOB, skin rash, weight gain,dark urine, leg swelling, back pain, headache, nausea and fatigue, etc. Patient agrees with the plan. She has history of tubal ligation-no chance of pregnancy.  Menorrhagia Recommend patient to discuss with gynecology for further workup and management.  Orders Placed This Encounter  Procedures   CBC with Differential (Cancer Center Only)    Standing Status:   Future    Number of Occurrences:   1    Standing Expiration Date:   10/15/2023   CBC with Differential (Cancer Center Only)    Standing Status:   Future    Standing Expiration Date:   10/15/2023   Iron and TIBC    Standing Status:   Future    Standing Expiration Date:   10/15/2023   Ferritin    Standing Status:   Future    Standing Expiration Date:   10/15/2023   Retic Panel    Standing Status:   Future    Standing Expiration Date:   10/15/2023   Hemoglobin and Hematocrit, Blood    Standing Status:   Future    Standing Expiration Date:   10/15/2023   Hold Tube- Blood Bank    Standing Status:   Future    Number of Occurrences:   1    Standing Expiration Date:   10/15/2023   Follow-up in 10 weeks All questions were  answered. The patient knows to call the clinic with any problems, questions or concerns.  Rickard Patience, MD, PhD [facility] Hematology Oncology 10/15/2022     HISTORY OF PRESENTING ILLNESS:  Sophia Olson is a  32 y.o.  female with PMH listed below who was referred to me for anemia Reviewed patient's recent labs that was done.  Patient has a history of chronic anemia. Recently CBC on 10/07/2022 showed hemoglobin of 6.6, MCV 65, normal platelet count and white count. Iron panel showed ferritin of 3, iron saturation 3, TIBC 393. Patient reports feeling tired and slightly lightheaded. She has heavy menstrual period.  She has upcoming appointment with gynecology. She has started on oral iron supplementation for a few days. She denies recent chest pain on exertion, shortness of breath on minimal exertion, pre-syncopal episodes, or palpitations She had not noticed any recent bleeding such as epistaxis, hematuria or hematochezia.   MEDICAL HISTORY:  Past Medical History:  Diagnosis Date   Blood transfusion without reported diagnosis    History of postpartum hemorrhage    History of preterm delivery, currently pregnant    History of seizures    as a child; unknown cause   Preterm labor    Seizures (HCC)    Thromboembolism during pregnancy     SURGICAL HISTORY: Past Surgical History:  Procedure Laterality Date   CESAREAN SECTION     x3   CESAREAN SECTION  CESAREAN SECTION     CESAREAN SECTION WITH BILATERAL TUBAL LIGATION Bilateral 10/14/2019   Procedure: CESAREAN SECTION WITH BILATERAL TUBAL LIGATION;  Surgeon: Catalina Antigua, MD;  Location: [facility];  Service: Obstetrics;  Laterality: Bilateral;   WISDOM TOOTH EXTRACTION      SOCIAL HISTORY: Social History   Socioeconomic History   Marital status: Single    Spouse name: Not on file   Number of children: Not on file   Years of education: Not on file   Highest education level: Not on file  Occupational History   Not on  file  Tobacco Use   Smoking status: Former    Packs/day: 0.25    Years: 3.00    Additional pack years: 0.00    Total pack years: 0.75    Types: Cigarettes    Passive exposure: Never   Smokeless tobacco: Never  Vaping Use   Vaping Use: Never used  Substance and Sexual Activity   Alcohol use: No   Drug use: No   Sexual activity: Yes    Birth control/protection: None  Other Topics Concern   Not on file  Social History Narrative   Not on file   Social Determinants of Health   Financial Resource Strain: Not on file  Food Insecurity: No Food Insecurity (09/01/2019)   Hunger Vital Sign    Worried About Running Out of Food in the Last Year: Never true    Ran Out of Food in the Last Year: Never true  Transportation Needs: No Transportation Needs (09/01/2019)   PRAPARE - Administrator, Civil Service (Medical): No    Lack of Transportation (Non-Medical): No  Physical Activity: Not on file  Stress: Not on file  Social Connections: Not on file  Intimate Partner Violence: Not on file    FAMILY HISTORY: History reviewed. No pertinent family history.  ALLERGIES:  has No Known Allergies.  MEDICATIONS:  Current Outpatient Medications  Medication Sig Dispense Refill   CALCIUM-VITAMIN D PO Take 1 tablet by mouth daily.     cholecalciferol (VITAMIN D3) 25 MCG (1000 UNIT) tablet Take 1,000 Units by mouth daily.     Ferrous Sulfate 27 MG TABS Take 1 tablet (27 mg total) by mouth 2 (two) times daily. 180 tablet 3   ondansetron (ZOFRAN) 4 MG tablet Take 1 tablet (4 mg total) by mouth every 8 (eight) hours as needed for nausea or vomiting. 20 tablet 0   No current facility-administered medications for this visit.    Review of Systems  Constitutional:  Positive for fatigue. Negative for appetite change, chills and fever.  HENT:   Negative for hearing loss and voice change.   Eyes:  Negative for eye problems.  Respiratory:  Negative for chest tightness and cough.    Cardiovascular:  Negative for chest pain.  Gastrointestinal:  Negative for abdominal distention, abdominal pain and blood in stool.  Endocrine: Negative for hot flashes.  Genitourinary:  Positive for menstrual problem. Negative for difficulty urinating and frequency.   Musculoskeletal:  Negative for arthralgias.  Skin:  Negative for itching and rash.  Neurological:  Positive for light-headedness. Negative for extremity weakness.  Hematological:  Negative for adenopathy.  Psychiatric/Behavioral:  Negative for confusion.     PHYSICAL EXAMINATION: Vitals:   10/15/22 1519  BP: 112/64  Pulse: 84  Resp: 18  Temp: (!) 97.2 F (36.2 C)   Filed Weights   10/15/22 1519  Weight: 267 lb 14.4 oz (121.5 kg)  Physical Exam Constitutional:      General: She is not in acute distress. HENT:     Head: Normocephalic and atraumatic.  Eyes:     General: No scleral icterus. Cardiovascular:     Rate and Rhythm: Normal rate and regular rhythm.     Heart sounds: Normal heart sounds.  Pulmonary:     Effort: Pulmonary effort is normal. No respiratory distress.     Breath sounds: No wheezing.  Abdominal:     General: Bowel sounds are normal. There is no distension.     Palpations: Abdomen is soft.  Musculoskeletal:        General: No deformity. Normal range of motion.     Cervical back: Normal range of motion and neck supple.  Skin:    General: Skin is warm and dry.     Findings: No erythema or rash.  Neurological:     Mental Status: She is alert and oriented to person, place, and time. Mental status is at baseline.     Cranial Nerves: No cranial nerve deficit.     Coordination: Coordination normal.  Psychiatric:        Mood and Affect: Mood normal.      LABORATORY DATA:  I have reviewed the data as listed    Latest Ref Rng & Units 10/15/2022    4:01 PM 10/07/2022    4:07 PM 10/09/2021    4:25 PM  CBC  WBC 4.0 - 10.5 K/uL 5.9  4.2  4.3   Hemoglobin 12.0 - 15.0 g/dL 6.8  6.6  7.9    Hematocrit 36.0 - 46.0 % 24.6  24.5  27.7   Platelets 150 - 400 K/uL 394   309       Latest Ref Rng & Units 10/07/2022    4:07 PM 04/17/2016    1:53 PM  CMP  Glucose 70 - 99 mg/dL 74  90   BUN 6 - 20 mg/dL 14  14   Creatinine 3.24 - 1.00 mg/dL 4.01  0.27   Sodium 253 - 144 mmol/L 140  140   Potassium 3.5 - 5.2 mmol/L 4.1  3.7   Chloride 96 - 106 mmol/L 105  111   CO2 20 - 29 mmol/L 23  23   Calcium 8.7 - 10.2 mg/dL 9.0  8.9   Total Protein 6.0 - 8.5 g/dL 6.7  7.8   Total Bilirubin 0.0 - 1.2 mg/dL <6.6  0.4   Alkaline Phos 44 - 121 IU/L 86  93   AST 0 - 40 IU/L 12  17   ALT 0 - 32 IU/L 10  21    Lab Results  Component Value Date   IRON 12 (L) 10/07/2022   TIBC 393 10/07/2022   IRONPCTSAT 3 (LL) 10/07/2022   FERRITIN 3 (L) 10/07/2022     RADIOGRAPHIC STUDIES: I have personally reviewed the radiological images as listed and agreed with the findings in the report. No results found.

## 2022-10-15 NOTE — Assessment & Plan Note (Signed)
Recommend patient to discuss with gynecology for further workup and management.

## 2022-10-16 ENCOUNTER — Other Ambulatory Visit: Payer: Self-pay

## 2022-10-16 DIAGNOSIS — D500 Iron deficiency anemia secondary to blood loss (chronic): Secondary | ICD-10-CM

## 2022-10-16 LAB — PREPARE RBC (CROSSMATCH)

## 2022-10-17 ENCOUNTER — Inpatient Hospital Stay: Payer: No Typology Code available for payment source

## 2022-10-17 DIAGNOSIS — D500 Iron deficiency anemia secondary to blood loss (chronic): Secondary | ICD-10-CM

## 2022-10-17 DIAGNOSIS — D509 Iron deficiency anemia, unspecified: Secondary | ICD-10-CM | POA: Diagnosis not present

## 2022-10-17 MED ORDER — DIPHENHYDRAMINE HCL 25 MG PO CAPS
25.0000 mg | ORAL_CAPSULE | Freq: Once | ORAL | Status: AC
  Administered 2022-10-17: 25 mg via ORAL
  Filled 2022-10-17: qty 1

## 2022-10-17 MED ORDER — ACETAMINOPHEN 325 MG PO TABS
650.0000 mg | ORAL_TABLET | Freq: Once | ORAL | Status: AC
  Administered 2022-10-17: 650 mg via ORAL
  Filled 2022-10-17: qty 2

## 2022-10-17 MED ORDER — SODIUM CHLORIDE 0.9% IV SOLUTION
250.0000 mL | Freq: Once | INTRAVENOUS | Status: AC
  Administered 2022-10-17: 250 mL via INTRAVENOUS
  Filled 2022-10-17: qty 250

## 2022-10-17 NOTE — Patient Instructions (Signed)

## 2022-10-18 LAB — TYPE AND SCREEN
ABO/RH(D): O POS
Antibody Screen: NEGATIVE
Unit division: 0

## 2022-10-18 LAB — BPAM RBC
Blood Product Expiration Date: 202407312359
ISSUE DATE / TIME: 202406270836
Unit Type and Rh: 5100

## 2022-10-21 ENCOUNTER — Encounter: Payer: Self-pay | Admitting: Internal Medicine

## 2022-10-21 ENCOUNTER — Ambulatory Visit (INDEPENDENT_AMBULATORY_CARE_PROVIDER_SITE_OTHER): Payer: No Typology Code available for payment source | Admitting: Internal Medicine

## 2022-10-21 VITALS — BP 130/84 | HR 100 | Ht 68.0 in | Wt 270.4 lb

## 2022-10-21 DIAGNOSIS — N921 Excessive and frequent menstruation with irregular cycle: Secondary | ICD-10-CM | POA: Insufficient documentation

## 2022-10-21 DIAGNOSIS — K8012 Calculus of gallbladder with acute and chronic cholecystitis without obstruction: Secondary | ICD-10-CM

## 2022-10-21 DIAGNOSIS — D500 Iron deficiency anemia secondary to blood loss (chronic): Secondary | ICD-10-CM

## 2022-10-21 NOTE — Progress Notes (Signed)
Established Patient Office Visit  Subjective:  Patient ID: Sophia Olson, female    DOB: Dec 13, 1990  Age: 32 y.o. MRN: [id number]  Chief Complaint  Patient presents with   Follow-up    2 week follow up    Patient comes in for her follow-up today.  She was recently seen for her history of gallstones and was being set up with the surgeon for cholecystectomy.  However her labs showed a very low hemoglobin and iron and she was sent to hematologist.  She received 1 unit of packed RBCs.  And she is scheduled to get weekly IV iron infusions.   Patient already has a history of severe anemia from the past but she had stopped her follow-ups.  Her original surgery was planned in May 2023 but was canceled as her hemoglobin was very low at that time.   Patient reports that she has very heavy menstrual bleeding and is going to follow-up with her OB/GYN.  Today she feels a little bit better and more stronger since her blood transfusion.    No other concerns at this time.   Past Medical History:  Diagnosis Date   Blood transfusion without reported diagnosis    History of postpartum hemorrhage    History of preterm delivery, currently pregnant    History of seizures    as a child; unknown cause   Preterm labor    Seizures (HCC)    Thromboembolism during pregnancy     Past Surgical History:  Procedure Laterality Date   CESAREAN SECTION     x3   CESAREAN SECTION     CESAREAN SECTION     CESAREAN SECTION WITH BILATERAL TUBAL LIGATION Bilateral 10/14/2019   Procedure: CESAREAN SECTION WITH BILATERAL TUBAL LIGATION;  Surgeon: Catalina Antigua, MD;  Location: [facility];  Service: Obstetrics;  Laterality: Bilateral;   WISDOM TOOTH EXTRACTION      Social History   Socioeconomic History   Marital status: Single    Spouse name: Not on file   Number of children: Not on file   Years of education: Not on file   Highest education level: Not on file  Occupational History   Not on file   Tobacco Use   Smoking status: Former    Packs/day: 0.25    Years: 3.00    Additional pack years: 0.00    Total pack years: 0.75    Types: Cigarettes    Passive exposure: Never   Smokeless tobacco: Never  Vaping Use   Vaping Use: Never used  Substance and Sexual Activity   Alcohol use: No   Drug use: No   Sexual activity: Yes    Birth control/protection: None  Other Topics Concern   Not on file  Social History Narrative   Not on file   Social Determinants of Health   Financial Resource Strain: Not on file  Food Insecurity: No Food Insecurity (09/01/2019)   Hunger Vital Sign    Worried About Running Out of Food in the Last Year: Never true    Ran Out of Food in the Last Year: Never true  Transportation Needs: No Transportation Needs (09/01/2019)   PRAPARE - Administrator, Civil Service (Medical): No    Lack of Transportation (Non-Medical): No  Physical Activity: Not on file  Stress: Not on file  Social Connections: Not on file  Intimate Partner Violence: Not on file    History reviewed. No pertinent family history.  No Known Allergies  Review of Systems  Constitutional: Negative.   HENT: Negative.    Eyes: Negative.   Respiratory: Negative.  Negative for cough and shortness of breath.   Cardiovascular: Negative.  Negative for chest pain, palpitations and leg swelling.  Gastrointestinal: Negative.  Negative for abdominal pain, constipation, diarrhea, heartburn, nausea and vomiting.  Genitourinary: Negative.  Negative for dysuria and flank pain.  Musculoskeletal: Negative.  Negative for joint pain and myalgias.  Skin: Negative.   Neurological: Negative.  Negative for dizziness and headaches.  Endo/Heme/Allergies: Negative.   Psychiatric/Behavioral: Negative.  Negative for depression and suicidal ideas. The patient is not nervous/anxious.        Objective:   BP 130/84   Pulse 100   Ht 5\' 8"  (1.727 m)   Wt 270 lb 6.4 oz (122.7 kg)   LMP  10/07/2022 (Exact Date)   SpO2 98%   BMI 41.11 kg/m   Vitals:   10/21/22 1405  BP: 130/84  Pulse: 100  Height: 5\' 8"  (1.727 m)  Weight: 270 lb 6.4 oz (122.7 kg)  SpO2: 98%  BMI (Calculated): 41.12    Physical Exam Vitals and nursing note reviewed.  Constitutional:      Appearance: Normal appearance.  HENT:     Head: Normocephalic and atraumatic.     Nose: Nose normal.     Mouth/Throat:     Mouth: Mucous membranes are moist.     Pharynx: Oropharynx is clear.  Eyes:     Conjunctiva/sclera: Conjunctivae normal.     Pupils: Pupils are equal, round, and reactive to light.  Cardiovascular:     Rate and Rhythm: Normal rate and regular rhythm.     Pulses: Normal pulses.     Heart sounds: Normal heart sounds. No murmur heard. Pulmonary:     Effort: Pulmonary effort is normal.     Breath sounds: Normal breath sounds. No wheezing.  Abdominal:     General: Bowel sounds are normal.     Palpations: Abdomen is soft.     Tenderness: There is no abdominal tenderness. There is no right CVA tenderness or left CVA tenderness.  Musculoskeletal:        General: Normal range of motion.     Cervical back: Normal range of motion.     Right lower leg: No edema.     Left lower leg: No edema.  Skin:    General: Skin is warm and dry.  Neurological:     General: No focal deficit present.     Mental Status: She is alert and oriented to person, place, and time.  Psychiatric:        Mood and Affect: Mood normal.        Behavior: Behavior normal.      No results found for any visits on 10/21/22.  Recent Results (from the past 2160 hour(s))  CBC With Differential     Status: Abnormal   Collection Time: 10/07/22  4:07 PM  Result Value Ref Range   WBC 4.2 3.4 - 10.8 x10E3/uL   RBC 3.80 3.77 - 5.28 x10E6/uL   Hemoglobin 6.6 (LL) 11.1 - 15.9 g/dL   Hematocrit 16.1 (L) 09.6 - 46.6 %   MCV 65 (L) 79 - 97 fL   MCH 17.4 (L) 26.6 - 33.0 pg   MCHC 26.9 (L) 31.5 - 35.7 g/dL   RDW 04.5 (H) 40.9  - 15.4 %   Neutrophils 39 Not Estab. %   Lymphs 54 Not Estab. %   Monocytes 5 Not  Estab. %   Eos 1 Not Estab. %   Basos 1 Not Estab. %   Neutrophils Absolute 1.6 1.4 - 7.0 x10E3/uL   Lymphocytes Absolute 2.3 0.7 - 3.1 x10E3/uL   Monocytes Absolute 0.2 0.1 - 0.9 x10E3/uL   EOS (ABSOLUTE) 0.1 0.0 - 0.4 x10E3/uL   Basophils Absolute 0.0 0.0 - 0.2 x10E3/uL   Immature Granulocytes 0 Not Estab. %   Immature Grans (Abs) 0.0 0.0 - 0.1 x10E3/uL    Comment: **Effective November 18, 2022, profile [id number] CBC/Differential**   (No Platelet) will be made non-orderable. [facility] Offers:   [id number] CBC With Differential/Platelet   CMP14+EGFR     Status: None   Collection Time: 10/07/22  4:07 PM  Result Value Ref Range   Glucose 74 70 - 99 mg/dL   BUN 14 6 - 20 mg/dL   Creatinine, Ser 1.47 0.57 - 1.00 mg/dL   eGFR 96 >82 NF/AOZ/3.08   BUN/Creatinine Ratio 17 9 - 23   Sodium 140 134 - 144 mmol/L   Potassium 4.1 3.5 - 5.2 mmol/L   Chloride 105 96 - 106 mmol/L   CO2 23 20 - 29 mmol/L   Calcium 9.0 8.7 - 10.2 mg/dL   Total Protein 6.7 6.0 - 8.5 g/dL   Albumin 4.0 3.9 - 4.9 g/dL   Globulin, Total 2.7 1.5 - 4.5 g/dL   Bilirubin Total <6.5 0.0 - 1.2 mg/dL   Alkaline Phosphatase 86 44 - 121 IU/L   AST 12 0 - 40 IU/L   ALT 10 0 - 32 IU/L  Lipase     Status: None   Collection Time: 10/07/22  4:07 PM  Result Value Ref Range   Lipase 19 14 - 72 U/L  Iron and TIBC     Status: Abnormal   Collection Time: 10/07/22  4:07 PM  Result Value Ref Range   Total Iron Binding Capacity 393 250 - 450 ug/dL   UIBC 784 696 - 295 ug/dL   Iron 12 (L) 27 - 284 ug/dL   Iron Saturation 3 (LL) 15 - 55 %  Ferritin     Status: Abnormal   Collection Time: 10/07/22  4:07 PM  Result Value Ref Range   Ferritin 3 (L) 15 - 150 ng/mL  Specimen status report     Status: None   Collection Time: 10/07/22  4:07 PM  Result Value Ref Range   specimen status report Comment     Comment: Written Authorization Written  Authorization Written Authorization Received. Authorization received from [facility] Aunesti Pellegrino 10-08-2022 Logged by Victoriano Lain   Hold Tube- Blood Bank     Status: None   Collection Time: 10/15/22  4:01 PM  Result Value Ref Range   Blood Bank Specimen SAMPLE AVAILABLE FOR TESTING    Sample Expiration      10/18/2022,2359 Performed at [facility], [address] [place], [place] [address]   CBC with Differential (Cancer Center Only)     Status: Abnormal   Collection Time: 10/15/22  4:01 PM  Result Value Ref Range   WBC Count 5.9 4.0 - 10.5 K/uL   RBC 3.81 (L) 3.87 - 5.11 MIL/uL   Hemoglobin 6.8 (LL) 12.0 - 15.0 g/dL    Comment: Reticulocyte Hemoglobin testing may be clinically indicated, consider ordering this additional test [id number] THIS CRITICAL RESULT HAS VERIFIED AND BEEN CALLED TO ELIZABETH SANTOS BY JOY KARANJA ON 06 25 2024 AT 1627, AND HAS BEEN READ BACK. RTV    HCT 24.6 (L) 36.0 -  46.0 %   MCV 64.6 (L) 80.0 - 100.0 fL   MCH 17.8 (L) 26.0 - 34.0 pg   MCHC 27.6 (L) 30.0 - 36.0 g/dL   RDW 16.1 (H) 09.6 - 04.5 %   Platelet Count 394 150 - 400 K/uL   nRBC 0.0 0.0 - 0.2 %   Neutrophils Relative % 47 %   Neutro Abs 2.8 1.7 - 7.7 K/uL   Lymphocytes Relative 47 %   Lymphs Abs 2.8 0.7 - 4.0 K/uL   Monocytes Relative 5 %   Monocytes Absolute 0.3 0.1 - 1.0 K/uL   Eosinophils Relative 1 %   Eosinophils Absolute 0.1 0.0 - 0.5 K/uL   Basophils Relative 0 %   Basophils Absolute 0.0 0.0 - 0.1 K/uL   WBC Morphology MORPHOLOGY UNREMARKABLE    RBC Morphology See Note     Comment: POLYCHROMASIA HYPOCHROMASIA POIKIOLOCYTOSIS   Smear Review PLATELETS APPEAR ADEQUATE     Comment: See Note HYPOGRANULAR    Immature Granulocytes 0 %   Abs Immature Granulocytes 0.02 0.00 - 0.07 K/uL   Schistocytes PRESENT    Tear Drop Cells PRESENT    Polychromasia PRESENT     Comment: Performed at [facility], [address] [place], [place] [address]  Type and screen     Status:  None   Collection Time: 10/15/22  4:01 PM  Result Value Ref Range   ABO/RH(D) O POS    Antibody Screen NEG    Sample Expiration 10/18/2022,2359    Unit Number [id number]    Blood Component Type RBC LR PHER2    Unit division 00    Status of Unit ISSUED,FINAL    Transfusion Status OK TO TRANSFUSE    Crossmatch Result      Compatible Performed at [facility], [address] [place], [place] [address]   BPAM RBC     Status: None   Collection Time: 10/15/22  4:01 PM  Result Value Ref Range   ISSUE DATE / TIME [id number]    Blood Product Unit Number [id number]    PRODUCT CODE [id number]    Unit Type and Rh 5100    Blood Product Expiration Date [id number]   ABO/Rh     Status: None   Collection Time: 10/15/22  4:19 PM  Result Value Ref Range   ABO/RH(D)      O POS Performed at [facility], [address] [place], [place] [address]   Prepare RBC (crossmatch)     Status: None   Collection Time: 10/16/22  9:30 AM  Result Value Ref Range   Order Confirmation      ORDER PROCESSED BY BLOOD BANK Performed at [facility], [address], [place], [place] [address]       Assessment & Plan:  Patient advised to keep her follow-ups with the hematologist and to get her weekly IV iron injections.  She will contact the surgeon again once her hemoglobin has improved. Problem List Items Addressed This Visit     Calculus of gallbladder with acute on chronic cholecystitis without obstruction   IDA (iron deficiency anemia) - Primary (Chronic)   Metrorrhagia    Return in about 6 weeks (around 12/02/2022).   Total time spent: 25 minutes  Margaretann Loveless, MD  10/21/2022   This document may have been prepared by [facility] Voice Recognition software and as such may include unintentional dictation errors.

## 2022-10-23 ENCOUNTER — Inpatient Hospital Stay: Payer: No Typology Code available for payment source | Attending: Oncology

## 2022-10-23 ENCOUNTER — Other Ambulatory Visit: Payer: Self-pay | Admitting: Oncology

## 2022-10-23 VITALS — BP 100/69 | HR 81 | Temp 97.2°F | Resp 16

## 2022-10-23 DIAGNOSIS — D509 Iron deficiency anemia, unspecified: Secondary | ICD-10-CM | POA: Insufficient documentation

## 2022-10-23 MED ORDER — SODIUM CHLORIDE 0.9 % IV SOLN
Freq: Once | INTRAVENOUS | Status: AC
  Filled 2022-10-23: qty 250

## 2022-10-23 MED ORDER — SODIUM CHLORIDE 0.9 % IV SOLN
200.0000 mg | Freq: Once | INTRAVENOUS | Status: AC
  Administered 2022-10-23: 200 mg via INTRAVENOUS
  Filled 2022-10-23: qty 200

## 2022-10-25 ENCOUNTER — Other Ambulatory Visit: Payer: Self-pay

## 2022-10-25 DIAGNOSIS — D509 Iron deficiency anemia, unspecified: Secondary | ICD-10-CM

## 2022-10-28 ENCOUNTER — Inpatient Hospital Stay: Payer: No Typology Code available for payment source

## 2022-10-28 VITALS — BP 101/65 | HR 73 | Temp 98.2°F | Resp 18

## 2022-10-28 DIAGNOSIS — D509 Iron deficiency anemia, unspecified: Secondary | ICD-10-CM | POA: Diagnosis not present

## 2022-10-28 DIAGNOSIS — D500 Iron deficiency anemia secondary to blood loss (chronic): Secondary | ICD-10-CM

## 2022-10-28 LAB — HEMOGLOBIN AND HEMATOCRIT, BLOOD
HCT: 30.5 % — ABNORMAL LOW (ref 36.0–46.0)
Hemoglobin: 8.8 g/dL — ABNORMAL LOW (ref 12.0–15.0)

## 2022-10-28 MED ORDER — SODIUM CHLORIDE 0.9 % IV SOLN
Freq: Once | INTRAVENOUS | Status: AC
  Filled 2022-10-28: qty 250

## 2022-10-28 MED ORDER — SODIUM CHLORIDE 0.9 % IV SOLN
200.0000 mg | Freq: Once | INTRAVENOUS | Status: AC
  Administered 2022-10-28: 200 mg via INTRAVENOUS
  Filled 2022-10-28: qty 200

## 2022-10-28 NOTE — Progress Notes (Unsigned)
No urine pregnancy test ordered. LMP per patient was 10/07/22.

## 2022-10-30 ENCOUNTER — Ambulatory Visit: Payer: Medicaid Other

## 2022-10-30 ENCOUNTER — Other Ambulatory Visit: Payer: Medicaid Other

## 2022-10-30 LAB — SAMPLE TO BLOOD BANK

## 2022-10-31 ENCOUNTER — Inpatient Hospital Stay: Payer: No Typology Code available for payment source

## 2022-11-06 ENCOUNTER — Inpatient Hospital Stay: Payer: No Typology Code available for payment source

## 2022-11-06 VITALS — BP 107/68 | HR 71 | Temp 96.3°F | Resp 19

## 2022-11-06 DIAGNOSIS — D509 Iron deficiency anemia, unspecified: Secondary | ICD-10-CM | POA: Diagnosis not present

## 2022-11-06 MED ORDER — SODIUM CHLORIDE 0.9 % IV SOLN
Freq: Once | INTRAVENOUS | Status: AC
  Filled 2022-11-06: qty 250

## 2022-11-06 MED ORDER — SODIUM CHLORIDE 0.9 % IV SOLN
200.0000 mg | Freq: Once | INTRAVENOUS | Status: AC
  Administered 2022-11-06: 200 mg via INTRAVENOUS
  Filled 2022-11-06: qty 200

## 2022-11-13 ENCOUNTER — Inpatient Hospital Stay: Payer: No Typology Code available for payment source

## 2022-11-13 VITALS — BP 112/69 | HR 79 | Temp 97.1°F | Resp 18

## 2022-11-13 DIAGNOSIS — D509 Iron deficiency anemia, unspecified: Secondary | ICD-10-CM | POA: Diagnosis not present

## 2022-11-13 MED ORDER — SODIUM CHLORIDE 0.9 % IV SOLN
200.0000 mg | Freq: Once | INTRAVENOUS | Status: AC
  Administered 2022-11-13: 200 mg via INTRAVENOUS
  Filled 2022-11-13: qty 200

## 2022-11-13 MED ORDER — SODIUM CHLORIDE 0.9 % IV SOLN
Freq: Once | INTRAVENOUS | Status: AC
  Filled 2022-11-13: qty 250

## 2022-11-14 ENCOUNTER — Ambulatory Visit (INDEPENDENT_AMBULATORY_CARE_PROVIDER_SITE_OTHER): Payer: No Typology Code available for payment source | Admitting: Certified Nurse Midwife

## 2022-11-14 ENCOUNTER — Encounter: Payer: Self-pay | Admitting: Certified Nurse Midwife

## 2022-11-14 VITALS — BP 100/51 | HR 74 | Ht 68.0 in | Wt 267.0 lb

## 2022-11-14 DIAGNOSIS — N926 Irregular menstruation, unspecified: Secondary | ICD-10-CM

## 2022-11-14 DIAGNOSIS — Z30017 Encounter for initial prescription of implantable subdermal contraceptive: Secondary | ICD-10-CM

## 2022-11-14 MED ORDER — ETONOGESTREL 68 MG ~~LOC~~ IMPL
68.0000 mg | DRUG_IMPLANT | Freq: Once | SUBCUTANEOUS | Status: AC
Start: 2022-11-14 — End: 2022-11-14
  Administered 2022-11-14: 68 mg via SUBCUTANEOUS

## 2022-11-14 NOTE — Progress Notes (Signed)
Sophia Eva, NP   Chief Complaint  Patient presents with   Menstrual Problem    HPI:      Sophia Olson is a 32 y.o. [phone] whose LMP was Patient's last menstrual period was 10/23/2022 (approximate)., presents today for frequent & heavy menses with severe anemia. She has recently received both a blood transfusion and iron infusions to treat her anemia. She desires to discuss pharmacologic methods for management of heavy menses, she had a bilateral tubal ligation with her last LTCS.  After discussion of options [name] desires Nexplanon placement for management of menorrhagia. She provided informed consent, signed copy in the chart, time out was performed. Pregnancy test was deferred given prior BTL, with self reported LMP of Patient's last menstrual period was 10/23/2022.   She understands that Nexplanon is a progesterone only therapy, and that patients often have irregular and unpredictable vaginal bleeding or amenorrhea. She understands that other side effects are possible related to systemic progesterone, including but not limited to, headaches, breast tenderness, nausea, and irritability. While effective at preventing pregnancy long acting reversible contraceptives do not prevent transmission of sexually transmitted diseases and use of barrier methods for this purpose was discussed. The placement procedure for Nexplanon was reviewed with the patient in detail including risks of nerve injury, infection, bleeding and injury to other muscles or tendons. She understands that the Nexplanon implant is good for 3 years and needs to be removed at the end of that time.  She understands that Nexplanon is an extremely effective option for contraception, with failure rate of <1%. This information is reviewed today and all questions were answered. Informed consent was obtained, both verbally and written.     Patient Active Problem List   Diagnosis Date Noted   Metrorrhagia 10/21/2022   IDA  (iron deficiency anemia) 10/15/2022   Menorrhagia 10/15/2022   Calculus of gallbladder with acute on chronic cholecystitis without obstruction 10/07/2022   Right upper quadrant abdominal pain 10/07/2022   Nausea 10/07/2022   Postpartum anemia 10/15/2019   Previous cesarean section complicating pregnancy, antepartum condition or complication 10/14/2019   PPH (postpartum hemorrhage) 10/14/2019   Alpha thalassemia silent carrier 05/19/2019   Genetic carrier 05/19/2019   Unwanted fertility 04/29/2019   Supervision of high risk pregnancy, antepartum 04/26/2019   Obesity, morbid, BMI 50 or higher (HCC) 04/26/2019   History of pulmonary embolus during pregnancy    History of preterm delivery, currently pregnant    History of low birth weight    History of 3 cesarean sections    Chlamydia infection affecting pregnancy in third trimester, antepartum 01/08/2015   Herpes simplex infection during pregnancy in third trimester 01/08/2015   Morbid obesity with BMI of 45.0-49.9, adult (HCC) 01/08/2015   Open wound of finger with complication 04/22/1898    Past Surgical History:  Procedure Laterality Date   CESAREAN SECTION     x3   CESAREAN SECTION     CESAREAN SECTION     CESAREAN SECTION WITH BILATERAL TUBAL LIGATION Bilateral 10/14/2019   Procedure: CESAREAN SECTION WITH BILATERAL TUBAL LIGATION;  Surgeon: Catalina Antigua, MD;  Location: [facility];  Service: Obstetrics;  Laterality: Bilateral;   WISDOM TOOTH EXTRACTION      History reviewed. No pertinent family history.  Social History   Socioeconomic History   Marital status: Single    Spouse name: Not on file   Number of children: Not on file   Years of education: Not on file   Highest  education level: Not on file  Occupational History   Not on file  Tobacco Use   Smoking status: Former    Current packs/day: 0.25    Average packs/day: 0.3 packs/day for 3.0 years (0.8 ttl pk-yrs)    Types: Cigarettes    Passive exposure: Never    Smokeless tobacco: Never  Vaping Use   Vaping status: Never Used  Substance and Sexual Activity   Alcohol use: No   Drug use: No   Sexual activity: Yes    Birth control/protection: None  Other Topics Concern   Not on file  Social History Narrative   Not on file   Social Determinants of Health   Financial Resource Strain: Not on file  Food Insecurity: No Food Insecurity (09/01/2019)   Hunger Vital Sign    Worried About Running Out of Food in the Last Year: Never true    Ran Out of Food in the Last Year: Never true  Transportation Needs: No Transportation Needs (09/01/2019)   PRAPARE - Administrator, Civil Service (Medical): No    Lack of Transportation (Non-Medical): No  Physical Activity: Not on file  Stress: Not on file  Social Connections: Not on file  Intimate Partner Violence: Not on file    Outpatient Medications Prior to Visit  Medication Sig Dispense Refill   CALCIUM-VITAMIN D PO Take 1 tablet by mouth daily.     cholecalciferol (VITAMIN D3) 25 MCG (1000 UNIT) tablet Take 1,000 Units by mouth daily.     etonogestrel (NEXPLANON) 68 MG IMPL implant 1 each by Subdermal route once.     Ferrous Sulfate 27 MG TABS Take 1 tablet (27 mg total) by mouth 2 (two) times daily. 180 tablet 3   ondansetron (ZOFRAN) 4 MG tablet Take 1 tablet (4 mg total) by mouth every 8 (eight) hours as needed for nausea or vomiting. 20 tablet 0   No facility-administered medications prior to visit.      ROS:  Review of Systems  Constitutional: Negative.   Genitourinary:  Negative for menstrual problem.     OBJECTIVE:   Vitals:  BP (!) 100/51   Pulse 74   Ht 5\' 8"  (1.727 m)   Wt 267 lb (121.1 kg)   LMP 10/23/2022 (Approximate) Comment: Pt denies any chance of being pregnant and states last cycle was "about 3 weeks ago.  BMI 40.60 kg/m   Physical Exam Constitutional:      Appearance: Normal appearance.  Cardiovascular:     Rate and Rhythm: Normal rate.   Pulmonary:     Effort: Pulmonary effort is normal.  Skin:    General: Skin is warm and dry.  Neurological:     General: No focal deficit present.     Mental Status: She is alert and oriented to person, place, and time.  Psychiatric:        Mood and Affect: Mood normal.        Behavior: Behavior normal.     Procedure Appropriate time out taken.  Patient placed in dorsal supine with left arm above head, elbow flexed at 90 degrees, arm resting on examination table.  The bicipital groove was palpated and site 8-10cm proximal to the medial epicondyle was identified. The insertion site was prepped with a two betadine swabs and then injected with 2 ml of 1% lidocaine without epinephrine.  Nexplanon removed form sterile blister packaging,  Device confirmed in needle, before inserting full length of needle, tenting up the skin as the  needle was advanced.  The drug eluding rod was then deployed by pulling back the slider per the manufactures recommendation.  The implant was palpable by the clinician as well as the patient.  The insertion site was dressed with a steri strip and band aid before applying  a kerlex bandage pressure dressing. Minimal blood loss was noted during the procedure.  The patient tolerated the procedure well.   She was instructed to wear the bandage for 24 hours, call with any signs of infection.  She was given the Nexplanon card and instructed to have the rod removed in 3 years.  Results: No results found for this or any previous visit (from the past 24 hour(s)).   Assessment/Plan: Irregular periods/menstrual cycles - Plan: etonogestrel (NEXPLANON) implant 68 mg    Meds ordered this encounter  Medications   etonogestrel (NEXPLANON) implant 68 mg     Dominica Severin, CNM 11/14/2022 4:41 PM

## 2022-11-27 ENCOUNTER — Ambulatory Visit: Payer: No Typology Code available for payment source | Admitting: Surgery

## 2022-12-02 ENCOUNTER — Encounter: Payer: Self-pay | Admitting: Internal Medicine

## 2022-12-02 ENCOUNTER — Ambulatory Visit (INDEPENDENT_AMBULATORY_CARE_PROVIDER_SITE_OTHER): Payer: No Typology Code available for payment source | Admitting: Internal Medicine

## 2022-12-02 VITALS — BP 106/66 | HR 88 | Ht 68.0 in | Wt 271.8 lb

## 2022-12-02 DIAGNOSIS — D500 Iron deficiency anemia secondary to blood loss (chronic): Secondary | ICD-10-CM

## 2022-12-02 DIAGNOSIS — K8012 Calculus of gallbladder with acute and chronic cholecystitis without obstruction: Secondary | ICD-10-CM | POA: Diagnosis not present

## 2022-12-02 DIAGNOSIS — N921 Excessive and frequent menstruation with irregular cycle: Secondary | ICD-10-CM | POA: Diagnosis not present

## 2022-12-02 NOTE — Progress Notes (Signed)
Established Patient Office Visit  Subjective:  Patient ID: Sophia Olson, female    DOB: 22-Feb-1991  Age: 32 y.o. MRN: [id number]  Chief Complaint  Patient presents with   Follow-up    6 week follow up    Patient comes in for her follow-up today.  She was recently referred for gallbladder surgery for gallstones.  At that time she was also found to have very low H&H.  She has a known history of anemia due to heavy menstrual bleeding.  However since then she has been seen by her OB/GYN as well as hematologist.  Her hemoglobin has improved with IV iron infusions.  Today patient feels much better and has more energy. Patient advised to call her surgeon and schedule her cholecystectomy now.    No other concerns at this time.   Past Medical History:  Diagnosis Date   Blood transfusion without reported diagnosis    History of postpartum hemorrhage    History of preterm delivery, currently pregnant    History of seizures    as a child; unknown cause   Preterm labor    Seizures (HCC)    Thromboembolism during pregnancy     Past Surgical History:  Procedure Laterality Date   CESAREAN SECTION     x3   CESAREAN SECTION     CESAREAN SECTION     CESAREAN SECTION WITH BILATERAL TUBAL LIGATION Bilateral 10/14/2019   Procedure: CESAREAN SECTION WITH BILATERAL TUBAL LIGATION;  Surgeon: Catalina Antigua, MD;  Location: [facility];  Service: Obstetrics;  Laterality: Bilateral;   WISDOM TOOTH EXTRACTION      Social History   Socioeconomic History   Marital status: Single    Spouse name: Not on file   Number of children: Not on file   Years of education: Not on file   Highest education level: Not on file  Occupational History   Not on file  Tobacco Use   Smoking status: Former    Current packs/day: 0.25    Average packs/day: 0.3 packs/day for 3.0 years (0.8 ttl pk-yrs)    Types: Cigarettes    Passive exposure: Never   Smokeless tobacco: Never  Vaping Use   Vaping status: Never  Used  Substance and Sexual Activity   Alcohol use: No   Drug use: No   Sexual activity: Yes    Birth control/protection: None  Other Topics Concern   Not on file  Social History Narrative   Not on file   Social Determinants of Health   Financial Resource Strain: Not on file  Food Insecurity: No Food Insecurity (09/01/2019)   Hunger Vital Sign    Worried About Running Out of Food in the Last Year: Never true    Ran Out of Food in the Last Year: Never true  Transportation Needs: No Transportation Needs (09/01/2019)   PRAPARE - Administrator, Civil Service (Medical): No    Lack of Transportation (Non-Medical): No  Physical Activity: Not on file  Stress: Not on file  Social Connections: Not on file  Intimate Partner Violence: Not on file    History reviewed. No pertinent family history.  No Known Allergies  Review of Systems  Constitutional: Negative.   HENT: Negative.    Eyes: Negative.   Respiratory: Negative.  Negative for cough and shortness of breath.   Cardiovascular: Negative.  Negative for chest pain, palpitations and leg swelling.  Gastrointestinal: Negative.  Negative for abdominal pain, constipation, diarrhea, heartburn, nausea and vomiting.  Genitourinary: Negative.  Negative for dysuria and flank pain.  Musculoskeletal: Negative.  Negative for joint pain and myalgias.  Skin: Negative.   Neurological: Negative.  Negative for dizziness and headaches.  Endo/Heme/Allergies: Negative.   Psychiatric/Behavioral: Negative.  Negative for depression and suicidal ideas. The patient is not nervous/anxious.        Objective:   BP 106/66   Pulse 88   Ht 5\' 8"  (1.727 m)   Wt 271 lb 12.8 oz (123.3 kg)   LMP 10/23/2022 (Approximate) Comment: Pt denies any chance of being pregnant and states last cycle was "about 3 weeks ago.  SpO2 98%   BMI 41.33 kg/m   Vitals:   12/02/22 1421  BP: 106/66  Pulse: 88  Height: 5\' 8"  (1.727 m)  Weight: 271 lb 12.8 oz  (123.3 kg)  SpO2: 98%  BMI (Calculated): 41.34    Physical Exam Vitals and nursing note reviewed.  Constitutional:      Appearance: Normal appearance.  HENT:     Head: Normocephalic and atraumatic.     Nose: Nose normal.     Mouth/Throat:     Mouth: Mucous membranes are moist.     Pharynx: Oropharynx is clear.  Eyes:     Conjunctiva/sclera: Conjunctivae normal.     Pupils: Pupils are equal, round, and reactive to light.  Cardiovascular:     Rate and Rhythm: Normal rate and regular rhythm.     Pulses: Normal pulses.     Heart sounds: Normal heart sounds. No murmur heard. Pulmonary:     Effort: Pulmonary effort is normal.     Breath sounds: Normal breath sounds. No wheezing.  Abdominal:     General: Bowel sounds are normal.     Palpations: Abdomen is soft.     Tenderness: There is no abdominal tenderness. There is no right CVA tenderness or left CVA tenderness.  Musculoskeletal:        General: Normal range of motion.     Cervical back: Normal range of motion.     Right lower leg: No edema.     Left lower leg: No edema.  Skin:    General: Skin is warm and dry.  Neurological:     General: No focal deficit present.     Mental Status: She is alert and oriented to person, place, and time.  Psychiatric:        Mood and Affect: Mood normal.        Behavior: Behavior normal.      No results found for any visits on 12/02/22.  Recent Results (from the past 2160 hour(s))  CBC With Differential     Status: Abnormal   Collection Time: 10/07/22  4:07 PM  Result Value Ref Range   WBC 4.2 3.4 - 10.8 x10E3/uL   RBC 3.80 3.77 - 5.28 x10E6/uL   Hemoglobin 6.6 (LL) 11.1 - 15.9 g/dL   Hematocrit 30.8 (L) 65.7 - 46.6 %   MCV 65 (L) 79 - 97 fL   MCH 17.4 (L) 26.6 - 33.0 pg   MCHC 26.9 (L) 31.5 - 35.7 g/dL   RDW 84.6 (H) 96.2 - 95.2 %   Neutrophils 39 Not Estab. %   Lymphs 54 Not Estab. %   Monocytes 5 Not Estab. %   Eos 1 Not Estab. %   Basos 1 Not Estab. %   Neutrophils  Absolute 1.6 1.4 - 7.0 x10E3/uL   Lymphocytes Absolute 2.3 0.7 - 3.1 x10E3/uL   Monocytes Absolute 0.2 0.1 -  0.9 x10E3/uL   EOS (ABSOLUTE) 0.1 0.0 - 0.4 x10E3/uL   Basophils Absolute 0.0 0.0 - 0.2 x10E3/uL   Immature Granulocytes 0 Not Estab. %   Immature Grans (Abs) 0.0 0.0 - 0.1 x10E3/uL    Comment: **Effective November 18, 2022, profile [id number] CBC/Differential**   (No Platelet) will be made non-orderable. [facility] Offers:   [id number] CBC With Differential/Platelet   CMP14+EGFR     Status: None   Collection Time: 10/07/22  4:07 PM  Result Value Ref Range   Glucose 74 70 - 99 mg/dL   BUN 14 6 - 20 mg/dL   Creatinine, Ser 0.45 0.57 - 1.00 mg/dL   eGFR 96 >40 JW/JXB/1.47   BUN/Creatinine Ratio 17 9 - 23   Sodium 140 134 - 144 mmol/L   Potassium 4.1 3.5 - 5.2 mmol/L   Chloride 105 96 - 106 mmol/L   CO2 23 20 - 29 mmol/L   Calcium 9.0 8.7 - 10.2 mg/dL   Total Protein 6.7 6.0 - 8.5 g/dL   Albumin 4.0 3.9 - 4.9 g/dL   Globulin, Total 2.7 1.5 - 4.5 g/dL   Bilirubin Total <8.2 0.0 - 1.2 mg/dL   Alkaline Phosphatase 86 44 - 121 IU/L   AST 12 0 - 40 IU/L   ALT 10 0 - 32 IU/L  Lipase     Status: None   Collection Time: 10/07/22  4:07 PM  Result Value Ref Range   Lipase 19 14 - 72 U/L  Iron and TIBC     Status: Abnormal   Collection Time: 10/07/22  4:07 PM  Result Value Ref Range   Total Iron Binding Capacity 393 250 - 450 ug/dL   UIBC 956 213 - 086 ug/dL   Iron 12 (L) 27 - 578 ug/dL   Iron Saturation 3 (LL) 15 - 55 %  Ferritin     Status: Abnormal   Collection Time: 10/07/22  4:07 PM  Result Value Ref Range   Ferritin 3 (L) 15 - 150 ng/mL  Specimen status report     Status: None   Collection Time: 10/07/22  4:07 PM  Result Value Ref Range   specimen status report Comment     Comment: Written Authorization Written Authorization Written Authorization Received. Authorization received from [facility] Chantale Leugers 10-08-2022 Logged by Victoriano Lain   Hold Tube- Blood Bank     Status: None    Collection Time: 10/15/22  4:01 PM  Result Value Ref Range   Blood Bank Specimen SAMPLE AVAILABLE FOR TESTING    Sample Expiration      10/18/2022,2359 Performed at [facility], [address] [place], [place] [address]   CBC with Differential (Cancer Center Only)     Status: Abnormal   Collection Time: 10/15/22  4:01 PM  Result Value Ref Range   WBC Count 5.9 4.0 - 10.5 K/uL   RBC 3.81 (L) 3.87 - 5.11 MIL/uL   Hemoglobin 6.8 (LL) 12.0 - 15.0 g/dL    Comment: Reticulocyte Hemoglobin testing may be clinically indicated, consider ordering this additional test [id number] THIS CRITICAL RESULT HAS VERIFIED AND BEEN CALLED TO ELIZABETH SANTOS BY JOY KARANJA ON 06 25 2024 AT 1627, AND HAS BEEN READ BACK. RTV    HCT 24.6 (L) 36.0 - 46.0 %   MCV 64.6 (L) 80.0 - 100.0 fL   MCH 17.8 (L) 26.0 - 34.0 pg   MCHC 27.6 (L) 30.0 - 36.0 g/dL   RDW 44.0 (H) 10.2 - 72.5 %  Platelet Count 394 150 - 400 K/uL   nRBC 0.0 0.0 - 0.2 %   Neutrophils Relative % 47 %   Neutro Abs 2.8 1.7 - 7.7 K/uL   Lymphocytes Relative 47 %   Lymphs Abs 2.8 0.7 - 4.0 K/uL   Monocytes Relative 5 %   Monocytes Absolute 0.3 0.1 - 1.0 K/uL   Eosinophils Relative 1 %   Eosinophils Absolute 0.1 0.0 - 0.5 K/uL   Basophils Relative 0 %   Basophils Absolute 0.0 0.0 - 0.1 K/uL   WBC Morphology MORPHOLOGY UNREMARKABLE    RBC Morphology See Note     Comment: POLYCHROMASIA HYPOCHROMASIA POIKIOLOCYTOSIS   Smear Review PLATELETS APPEAR ADEQUATE     Comment: See Note HYPOGRANULAR    Immature Granulocytes 0 %   Abs Immature Granulocytes 0.02 0.00 - 0.07 K/uL   Schistocytes PRESENT    Tear Drop Cells PRESENT    Polychromasia PRESENT     Comment: Performed at [facility], [address] [place], [place] [address]  Type and screen     Status: None   Collection Time: 10/15/22  4:01 PM  Result Value Ref Range   ABO/RH(D) O POS    Antibody Screen NEG    Sample Expiration 10/18/2022,2359    Unit Number  [id number]    Blood Component Type RBC LR PHER2    Unit division 00    Status of Unit ISSUED,FINAL    Transfusion Status OK TO TRANSFUSE    Crossmatch Result      Compatible Performed at [facility], [address] [place], [place] [address]   BPAM RBC     Status: None   Collection Time: 10/15/22  4:01 PM  Result Value Ref Range   ISSUE DATE / TIME [id number]    Blood Product Unit Number [id number]    PRODUCT CODE [id number]    Unit Type and Rh 5100    Blood Product Expiration Date [id number]   ABO/Rh     Status: None   Collection Time: 10/15/22  4:19 PM  Result Value Ref Range   ABO/RH(D)      O POS Performed at [facility], [address], [place], [place] [address]   Prepare RBC (crossmatch)     Status: None   Collection Time: 10/16/22  9:30 AM  Result Value Ref Range   Order Confirmation      ORDER PROCESSED BY BLOOD BANK Performed at [facility], [address] [place], [place] [address]   Sample to Blood Bank     Status: None   Collection Time: 10/28/22  2:59 PM  Result Value Ref Range   Blood Bank Specimen SAMPLE AVAILABLE FOR TESTING    Sample Expiration      10/31/2022,2359 Performed at [facility], [address] [place], [place] [address]   Hemoglobin and Hematocrit, Blood     Status: Abnormal   Collection Time: 10/28/22  2:59 PM  Result Value Ref Range   Hemoglobin 8.8 (L) 12.0 - 15.0 g/dL   HCT 16.0 (L) 10.9 - 32.3 %    Comment: Performed at [facility], [address] [place], [place] [address]      Assessment & Plan:  To continue current medications and iron supplements.  Schedule cholecystectomy. Problem List Items Addressed This Visit     Calculus of gallbladder with acute on chronic cholecystitis without obstruction   IDA (iron deficiency anemia) - Primary (Chronic)   Metrorrhagia  Return in about 3 months (around 03/04/2023).   Total time spent: 25 minutes  Margaretann Loveless,  MD  12/02/2022   This document may have been prepared by [facility] Voice Recognition software and as such may include unintentional dictation errors.

## 2022-12-24 ENCOUNTER — Inpatient Hospital Stay: Payer: No Typology Code available for payment source | Attending: Oncology

## 2022-12-24 DIAGNOSIS — N920 Excessive and frequent menstruation with regular cycle: Secondary | ICD-10-CM | POA: Insufficient documentation

## 2022-12-24 DIAGNOSIS — D509 Iron deficiency anemia, unspecified: Secondary | ICD-10-CM | POA: Insufficient documentation

## 2022-12-24 DIAGNOSIS — D500 Iron deficiency anemia secondary to blood loss (chronic): Secondary | ICD-10-CM

## 2022-12-24 LAB — RETIC PANEL
Immature Retic Fract: 9.3 % (ref 2.3–15.9)
RBC.: 4.94 MIL/uL (ref 3.87–5.11)
Retic Count, Absolute: 36.1 10*3/uL (ref 19.0–186.0)
Retic Ct Pct: 0.7 % (ref 0.4–3.1)
Reticulocyte Hemoglobin: 27.9 pg — ABNORMAL LOW (ref >27.9)

## 2022-12-24 LAB — CBC WITH DIFFERENTIAL (CANCER CENTER ONLY)
Abs Immature Granulocytes: 0 10*3/uL (ref 0.00–0.07)
Basophils Absolute: 0 10*3/uL (ref 0.0–0.1)
Basophils Relative: 1 %
Eosinophils Absolute: 0.1 10*3/uL (ref 0.0–0.5)
Eosinophils Relative: 1 %
HCT: 37.1 % (ref 36.0–46.0)
Hemoglobin: 11.2 g/dL — ABNORMAL LOW (ref 12.0–15.0)
Immature Granulocytes: 0 %
Lymphocytes Relative: 51 %
Lymphs Abs: 2.2 10*3/uL (ref 0.7–4.0)
MCH: 22.8 pg — ABNORMAL LOW (ref 26.0–34.0)
MCHC: 30.2 g/dL (ref 30.0–36.0)
MCV: 75.6 fL — ABNORMAL LOW (ref 80.0–100.0)
Monocytes Absolute: 0.2 10*3/uL (ref 0.1–1.0)
Monocytes Relative: 5 %
Neutro Abs: 1.8 10*3/uL (ref 1.7–7.7)
Neutrophils Relative %: 42 %
Platelet Count: 243 10*3/uL (ref 150–400)
RBC: 4.91 MIL/uL (ref 3.87–5.11)
RDW: 21.9 % — ABNORMAL HIGH (ref 11.5–15.5)
WBC Count: 4.3 10*3/uL (ref 4.0–10.5)
nRBC: 0 % (ref 0.0–0.2)

## 2022-12-24 LAB — IRON AND TIBC
Iron: 79 ug/dL (ref 28–170)
Saturation Ratios: 24 % (ref 10.4–31.8)
TIBC: 330 ug/dL (ref 250–450)
UIBC: 251 ug/dL

## 2022-12-24 LAB — FERRITIN: Ferritin: 23 ng/mL (ref 11–307)

## 2022-12-25 ENCOUNTER — Inpatient Hospital Stay: Payer: No Typology Code available for payment source | Admitting: Oncology

## 2022-12-25 ENCOUNTER — Other Ambulatory Visit: Payer: Medicaid Other

## 2022-12-25 ENCOUNTER — Inpatient Hospital Stay: Payer: No Typology Code available for payment source

## 2022-12-30 ENCOUNTER — Ambulatory Visit: Payer: No Typology Code available for payment source | Admitting: Certified Nurse Midwife

## 2022-12-31 ENCOUNTER — Telehealth: Payer: Self-pay | Admitting: Oncology

## 2022-12-31 NOTE — Telephone Encounter (Signed)
This patient called and would like to be seen from her missed appointment sooner than next available. She request appointment with APP to be seen sooner. Please advise

## 2023-01-01 NOTE — Telephone Encounter (Signed)
Hemoglobin is much better on 9/3 labs, so she is good for appt in 2-3 weeks   MD/venofer

## 2023-01-17 ENCOUNTER — Encounter: Payer: Self-pay | Admitting: Oncology

## 2023-01-17 ENCOUNTER — Ambulatory Visit (INDEPENDENT_AMBULATORY_CARE_PROVIDER_SITE_OTHER): Payer: No Typology Code available for payment source | Admitting: Certified Nurse Midwife

## 2023-01-17 ENCOUNTER — Other Ambulatory Visit (HOSPITAL_COMMUNITY)
Admission: RE | Admit: 2023-01-17 | Discharge: 2023-01-17 | Disposition: A | Discharge status: Home / Self Care | Payer: No Typology Code available for payment source | Source: Ambulatory Visit | Attending: Certified Nurse Midwife | Admitting: Certified Nurse Midwife

## 2023-01-17 ENCOUNTER — Inpatient Hospital Stay: Payer: No Typology Code available for payment source

## 2023-01-17 ENCOUNTER — Encounter: Payer: Self-pay | Admitting: Certified Nurse Midwife

## 2023-01-17 ENCOUNTER — Inpatient Hospital Stay (HOSPITAL_BASED_OUTPATIENT_CLINIC_OR_DEPARTMENT_OTHER): Payer: No Typology Code available for payment source | Admitting: Oncology

## 2023-01-17 VITALS — BP 91/53 | HR 90 | Ht 68.0 in | Wt 270.0 lb

## 2023-01-17 VITALS — BP 109/67 | HR 78

## 2023-01-17 VITALS — BP 110/83 | HR 79 | Temp 97.8°F | Resp 18 | Wt 270.9 lb

## 2023-01-17 DIAGNOSIS — Z6841 Body Mass Index (BMI) 40.0 and over, adult: Secondary | ICD-10-CM

## 2023-01-17 DIAGNOSIS — D509 Iron deficiency anemia, unspecified: Secondary | ICD-10-CM

## 2023-01-17 DIAGNOSIS — Z1151 Encounter for screening for human papillomavirus (HPV): Secondary | ICD-10-CM

## 2023-01-17 DIAGNOSIS — Z124 Encounter for screening for malignant neoplasm of cervix: Secondary | ICD-10-CM

## 2023-01-17 DIAGNOSIS — N852 Hypertrophy of uterus: Secondary | ICD-10-CM

## 2023-01-17 DIAGNOSIS — N920 Excessive and frequent menstruation with regular cycle: Secondary | ICD-10-CM | POA: Diagnosis not present

## 2023-01-17 DIAGNOSIS — D500 Iron deficiency anemia secondary to blood loss (chronic): Secondary | ICD-10-CM

## 2023-01-17 DIAGNOSIS — Z01419 Encounter for gynecological examination (general) (routine) without abnormal findings: Secondary | ICD-10-CM

## 2023-01-17 MED ORDER — NORETHINDRONE 0.35 MG PO TABS: 1.0000 | ORAL_TABLET | Freq: Every day | ORAL | 0 refills | Status: DC

## 2023-01-17 MED ORDER — SODIUM CHLORIDE 0.9 % IV SOLN
Freq: Once | INTRAVENOUS | Status: AC
  Filled 2023-01-17: qty 250

## 2023-01-17 MED ORDER — SODIUM CHLORIDE 0.9 % IV SOLN
200.0000 mg | Freq: Once | INTRAVENOUS | Status: AC
  Administered 2023-01-17: 200 mg via INTRAVENOUS
  Filled 2023-01-17: qty 200

## 2023-01-17 MED ORDER — MEDROXYPROGESTERONE ACETATE 150 MG/ML IM SUSY
150.0000 mg | PREFILLED_SYRINGE | Freq: Once | INTRAMUSCULAR | Status: DC
Start: 2023-01-17 — End: 2023-01-17

## 2023-01-17 NOTE — Assessment & Plan Note (Signed)
Recommend patient to discuss with gynecology for further workup and management.

## 2023-01-17 NOTE — Assessment & Plan Note (Addendum)
Labs are reviewed and discussed with patient. Lab Results  Component Value Date   HGB 11.2 (L) 12/24/2022   TIBC 330 12/24/2022   IRONPCTSAT 24 12/24/2022   FERRITIN 23 12/24/2022  Hemoglobin has improved, not fully normalized.  Iron panel normalized. Given that she continues to have heavy menstrual period, I recommend IV Venofer 200 mg x 1 for maintenance.  Chronic microcytosis check hemoglobinopathy evaluation. She has history of tubal ligation-no chance of pregnancy.

## 2023-01-17 NOTE — Progress Notes (Unsigned)
ANNUAL EXAM Patient name: Sophia Olson MRN [id number]  Date of birth: 07-25-1990 Chief Complaint:   Gynecologic Exam and Menstrual Problem  History of Present Illness:   Sophia Olson is a 32 y.o. [phone] African-American female being seen today for a routine annual exam.  Current complaints: daily light to moderate bleeding since Nexplanon placement. Bleeding is less than it was but now daily. Having gall bladder removed soon once anemia improved. Would like to keep Nexplanon if bleeding improves.  Patient's last menstrual period was 11/06/2022.   Upstream - 01/17/23 1056       Pregnancy Intention Screening   Does the patient want to become pregnant in the next year? No    Does the patient's partner want to become pregnant in the next year? No    Would the patient like to discuss contraceptive options today? Yes      Contraception Wrap Up   Current Method Hormonal Implant    Contraception Counseling Provided Yes    How was the end contraceptive method provided? N/A            The pregnancy intention screening data noted above was reviewed. Potential methods of contraception were discussed. The patient elected to proceed with No data recorded.   No results found for: "DIAGPAP", "HPVHIGH", "ADEQPAP"     Last mammogram: n/a due to age. Results were: N/A. Family h/o breast cancer: no Last colonoscopy: n/a due to age. Results were: N/A. Family h/o colorectal cancer: no     01/17/2023    1:29 PM 09/17/2019    9:32 AM 09/01/2019   12:03 PM 06/25/2019   10:12 AM  Depression screen PHQ 2/9  Decreased Interest 0 0 0 0  Down, Depressed, Hopeless 0 0 0 0  PHQ - 2 Score 0 0 0 0  Altered sleeping  0 1 0  Tired, decreased energy  0 1 0  Change in appetite  0 0 0  Feeling bad or failure about yourself   0 0 0  Trouble concentrating  0 0 0  Moving slowly or fidgety/restless  0 0 0  Suicidal thoughts  0 0 0  PHQ-9 Score  0 2 0        09/17/2019    9:32 AM 09/01/2019    12:03 PM 06/25/2019   10:13 AM  GAD 7 : Generalized Anxiety Score  Nervous, Anxious, on Edge 0 0 0  Control/stop worrying 0 0 0  Worry too much - different things 0 0 0  Trouble relaxing 0 0 0  Restless 0 0 0  Easily annoyed or irritable 0 0 0  Afraid - awful might happen 0 0 0  Total GAD 7 Score 0 0 0  Anxiety Difficulty Not difficult at all       Review of Systems:   Pertinent items are noted in HPI Denies any headaches, blurred vision, fatigue, shortness of breath, chest pain, abdominal pain, abnormal vaginal discharge/itching/odor/irritation, problems with periods, bowel movements, urination, or intercourse unless otherwise stated above. Pertinent History Reviewed:  Reviewed past medical,surgical, social and family history.  Reviewed problem list, medications and allergies. Physical Assessment:   Vitals:   01/17/23 1053  BP: (!) 91/53  Pulse: 90  Weight: 270 lb (122.5 kg)  Height: 5\' 8"  (1.727 m)  Body mass index is 41.05 kg/m.       Physical Exam Vitals reviewed.  Constitutional:      Appearance: Normal appearance.  HENT:     Head: Normocephalic.  Neck:     Thyroid: No thyroid mass or thyromegaly.  Cardiovascular:     Rate and Rhythm: Normal rate and regular rhythm.     Heart sounds: Normal heart sounds.  Pulmonary:     Effort: Pulmonary effort is normal.     Breath sounds: Normal breath sounds.  Chest:  Breasts:    Tanner Score is 5.     Right: Normal.     Left: Normal.  Abdominal:     General: Abdomen is flat.     Palpations: Abdomen is soft.     Tenderness: There is no abdominal tenderness.  Genitourinary:    General: Normal vulva.     Vagina: Bleeding present.     Cervix: Dilated.     Uterus: Enlarged and tender.      Adnexa:        Right: No tenderness.         Left: No tenderness.       Comments: Moderate amount menstrual blood in vault, small clot at cervical os. Pap collected. Musculoskeletal:     Cervical back: Neck supple. No tenderness.   Skin:    General: Skin is warm and dry.  Neurological:     General: No focal deficit present.     Mental Status: She is alert and oriented to person, place, and time.  Psychiatric:        Mood and Affect: Mood normal.        Behavior: Behavior normal.      No results found for this or any previous visit (from the past 24 hour(s)).  Assessment & Plan:  1. Cervical cancer screening - Cytology - PAP  2. Encounter for screening for human papillomavirus (HPV) - Cytology - PAP  3. Menorrhagia with regular cycle - US PELVIC COMPLETE WITH TRANSVAGINAL; Future  4. Body mass index (BMI) of 40.0 to 44.9 in adult [facility]) - Referral to Nutrition and Diabetes Services  5. Well woman exam with routine gynecological exam  6. Enlarged uterus  Ultrasound ordered to assess for fibroids & enlarged uterus Trial 86m of oral progestin only pills to decrease bleeding. If no improvement will remove Nexplanon. Mammogram: @ 32yo, or sooner if problems Colonoscopy: @ 32yo, or sooner if problems Pap collected today  Orders Placed This Encounter  Procedures   US PELVIC COMPLETE WITH TRANSVAGINAL   Referral to Nutrition and Diabetes Services    Meds:  Meds ordered this encounter  Medications   DISCONTD: medroxyPROGESTERone Acetate SUSY 150 mg   norethindrone (ORTHO MICRONOR) 0.35 MG tablet    Sig: Take 1 tablet (0.35 mg total) by mouth daily.    Dispense:  84 tablet    Refill:  0    Order Specific Question:   Supervising Provider    Answer:   Hildred Laser [[id number]]    Follow-up: Return if symptoms worsen or fail to improve. If no improvement with micronor plan Nexplanon removal.  Dominica Severin, CNM 01/17/2023 1:29 PM

## 2023-01-17 NOTE — Patient Instructions (Signed)

## 2023-01-17 NOTE — Progress Notes (Signed)
Hematology/Oncology Consult note [phone] [phone]      Patient Care Team: [name], NP (Inactive) as PCP - General (Nurse Practitioner) Rickard Patience, MD as Consulting Physician (Oncology)   REFERRING PROVIDER: [name], NP  CHIEF COMPLAINTS/REASON FOR VISIT:  Anemia  ASSESSMENT & PLAN:  IDA (iron deficiency anemia) Labs are reviewed and discussed with patient. Lab Results  Component Value Date   HGB 11.2 (L) 12/24/2022   TIBC 330 12/24/2022   IRONPCTSAT 24 12/24/2022   FERRITIN 23 12/24/2022  Hemoglobin has improved, not fully normalized.  Iron panel normalized. Given that she continues to have heavy menstrual period, I recommend IV Venofer 200 mg x 1 for maintenance.  Chronic microcytosis check hemoglobinopathy evaluation. She has history of tubal ligation-no chance of pregnancy.  Menorrhagia Recommend patient to discuss with gynecology for further workup and management.  Orders Placed This Encounter  Procedures   CBC with Differential (Cancer Center Only)    Standing Status:   Future    Standing Expiration Date:   01/17/2024   Iron and TIBC    Standing Status:   Future    Standing Expiration Date:   01/17/2024   Ferritin    Standing Status:   Future    Standing Expiration Date:   01/17/2024   Retic Panel    Standing Status:   Future    Standing Expiration Date:   01/17/2024   Hgb Fractionation Cascade    Standing Status:   Future    Standing Expiration Date:   01/17/2024   Follow-up in 10 weeks All questions were answered. The patient knows to call the clinic with any problems, questions or concerns.  Rickard Patience, MD, PhD [facility] Hematology Oncology 01/17/2023     HISTORY OF PRESENTING ILLNESS:  Sophia Olson is a  32 y.o.  female with PMH listed below who was referred to me for anemia Reviewed patient's recent labs that was done.  Patient has a history of chronic anemia. Recently CBC on 10/07/2022 showed hemoglobin  of 6.6, MCV 65, normal platelet count and white count. Iron panel showed ferritin of 3, iron saturation 3, TIBC 393. Patient reports feeling tired and slightly lightheaded. She has heavy menstrual period.  She has upcoming appointment with gynecology. She has started on oral iron supplementation for a few days. She denies recent chest pain on exertion, shortness of breath on minimal exertion, pre-syncopal episodes, or palpitations She had not noticed any recent bleeding such as epistaxis, hematuria or hematochezia.    INTERVAL HISTORY Sophia Olson is a 32 y.o. female who has above history reviewed by me today presents for follow up visit for iron deficiency anemia.  She tolerates IV Venofer treatments.  Fatigue has improved.  MEDICAL HISTORY:  Past Medical History:  Diagnosis Date   Blood transfusion without reported diagnosis    History of postpartum hemorrhage    History of preterm delivery, currently pregnant    History of seizures    as a child; unknown cause   Preterm labor    Seizures (HCC)    Thromboembolism during pregnancy     SURGICAL HISTORY: Past Surgical History:  Procedure Laterality Date   CESAREAN SECTION     x3   CESAREAN SECTION     CESAREAN SECTION     CESAREAN SECTION WITH BILATERAL TUBAL LIGATION Bilateral 10/14/2019   Procedure: CESAREAN SECTION WITH BILATERAL TUBAL LIGATION;  Surgeon: Catalina Antigua, MD;  Location: [facility] LD ORS;  Service: Obstetrics;  Laterality: Bilateral;  WISDOM TOOTH EXTRACTION      SOCIAL HISTORY: Social History   Socioeconomic History   Marital status: Single    Spouse name: Not on file   Number of children: Not on file   Years of education: Not on file   Highest education level: Not on file  Occupational History   Not on file  Tobacco Use   Smoking status: Former    Current packs/day: 0.25    Average packs/day: 0.3 packs/day for 3.0 years (0.8 ttl pk-yrs)    Types: Cigarettes    Passive exposure: Never   Smokeless  tobacco: Never  Vaping Use   Vaping status: Never Used  Substance and Sexual Activity   Alcohol use: No   Drug use: No   Sexual activity: Yes    Birth control/protection: Implant  Other Topics Concern   Not on file  Social History Narrative   Not on file   Social Determinants of Health   Financial Resource Strain: Not on file  Food Insecurity: No Food Insecurity (09/01/2019)   Hunger Vital Sign    Worried About Running Out of Food in the Last Year: Never true    Ran Out of Food in the Last Year: Never true  Transportation Needs: No Transportation Needs (09/01/2019)   PRAPARE - Administrator, Civil Service (Medical): No    Lack of Transportation (Non-Medical): No  Physical Activity: Not on file  Stress: Not on file  Social Connections: Not on file  Intimate Partner Violence: Not on file    FAMILY HISTORY: History reviewed. No pertinent family history.  ALLERGIES:  has No Known Allergies.  MEDICATIONS:  Current Outpatient Medications  Medication Sig Dispense Refill   CALCIUM-VITAMIN D PO Take 1 tablet by mouth daily.     cholecalciferol (VITAMIN D3) 25 MCG (1000 UNIT) tablet Take 1,000 Units by mouth daily.     etonogestrel (NEXPLANON) 68 MG IMPL implant 1 each by Subdermal route once.     Ferrous Sulfate 27 MG TABS Take 1 tablet (27 mg total) by mouth 2 (two) times daily. 180 tablet 3   norethindrone (ORTHO MICRONOR) 0.35 MG tablet Take 1 tablet (0.35 mg total) by mouth daily. 84 tablet 0   ondansetron (ZOFRAN) 4 MG tablet Take 1 tablet (4 mg total) by mouth every 8 (eight) hours as needed for nausea or vomiting. 20 tablet 0   No current facility-administered medications for this visit.    Review of Systems  Constitutional:  Positive for fatigue. Negative for appetite change, chills and fever.  HENT:   Negative for hearing loss and voice change.   Eyes:  Negative for eye problems.  Respiratory:  Negative for chest tightness and cough.   Cardiovascular:   Negative for chest pain.  Gastrointestinal:  Negative for abdominal distention, abdominal pain and blood in stool.  Endocrine: Negative for hot flashes.  Genitourinary:  Positive for menstrual problem. Negative for difficulty urinating and frequency.   Musculoskeletal:  Negative for arthralgias.  Skin:  Negative for itching and rash.  Neurological:  Negative for extremity weakness and light-headedness.  Hematological:  Negative for adenopathy.  Psychiatric/Behavioral:  Negative for confusion.     PHYSICAL EXAMINATION: Vitals:   01/17/23 1307  BP: 110/83  Pulse: 79  Resp: 18  Temp: 97.8 F (36.6 C)   Filed Weights   01/17/23 1307  Weight: 270 lb 14.4 oz (122.9 kg)    Physical Exam Constitutional:      General: She is not  in acute distress. HENT:     Head: Normocephalic and atraumatic.  Eyes:     General: No scleral icterus. Cardiovascular:     Rate and Rhythm: Normal rate and regular rhythm.  Pulmonary:     Effort: Pulmonary effort is normal. No respiratory distress.     Breath sounds: No wheezing.  Abdominal:     General: Bowel sounds are normal. There is no distension.     Palpations: Abdomen is soft.  Musculoskeletal:        General: Normal range of motion.     Cervical back: Normal range of motion and neck supple.  Skin:    General: Skin is warm and dry.     Findings: No rash.  Neurological:     Mental Status: She is alert and oriented to person, place, and time. Mental status is at baseline.  Psychiatric:        Mood and Affect: Mood normal.      LABORATORY DATA:  I have reviewed the data as listed    Latest Ref Rng & Units 12/24/2022    8:31 AM 10/28/2022    2:59 PM 10/15/2022    4:01 PM  CBC  WBC 4.0 - 10.5 K/uL 4.3   5.9   Hemoglobin 12.0 - 15.0 g/dL 16.1  8.8  6.8   Hematocrit 36.0 - 46.0 % 37.1  30.5  24.6   Platelets 150 - 400 K/uL 243   394       Latest Ref Rng & Units 10/07/2022    4:07 PM 04/17/2016    1:53 PM  CMP  Glucose 70 - 99 mg/dL  74  90   BUN 6 - 20 mg/dL 14  14   Creatinine 0.96 - 1.00 mg/dL 0.45  4.09   Sodium 811 - 144 mmol/L 140  140   Potassium 3.5 - 5.2 mmol/L 4.1  3.7   Chloride 96 - 106 mmol/L 105  111   CO2 20 - 29 mmol/L 23  23   Calcium 8.7 - 10.2 mg/dL 9.0  8.9   Total Protein 6.0 - 8.5 g/dL 6.7  7.8   Total Bilirubin 0.0 - 1.2 mg/dL <9.1  0.4   Alkaline Phos 44 - 121 IU/L 86  93   AST 0 - 40 IU/L 12  17   ALT 0 - 32 IU/L 10  21    Lab Results  Component Value Date   IRON 79 12/24/2022   TIBC 330 12/24/2022   IRONPCTSAT 24 12/24/2022   FERRITIN 23 12/24/2022     RADIOGRAPHIC STUDIES: I have personally reviewed the radiological images as listed and agreed with the findings in the report. No results found.

## 2023-01-21 ENCOUNTER — Ambulatory Visit
Admission: RE | Admit: 2023-01-21 | Discharge: 2023-01-21 | Disposition: A | Discharge status: Home / Self Care | Payer: No Typology Code available for payment source | Source: Ambulatory Visit | Attending: Certified Nurse Midwife | Admitting: Certified Nurse Midwife

## 2023-01-21 DIAGNOSIS — N920 Excessive and frequent menstruation with regular cycle: Secondary | ICD-10-CM | POA: Insufficient documentation

## 2023-01-27 LAB — CYTOLOGY - PAP
Comment: NEGATIVE
Diagnosis: NEGATIVE
Diagnosis: REACTIVE
High risk HPV: NEGATIVE

## 2023-02-12 ENCOUNTER — Ambulatory Visit: Payer: No Typology Code available for payment source | Admitting: Surgery

## 2023-02-12 ENCOUNTER — Encounter: Payer: Self-pay | Admitting: Surgery

## 2023-02-12 VITALS — BP 110/75 | HR 89 | Temp 98.5°F | Ht 68.0 in | Wt 275.2 lb

## 2023-02-12 DIAGNOSIS — K8020 Calculus of gallbladder without cholecystitis without obstruction: Secondary | ICD-10-CM | POA: Diagnosis not present

## 2023-02-12 NOTE — H&P (View-Only) (Signed)
02/12/2023  History of Present Illness: Sophia Olson is a 32 y.o. female presenting for follow-up of symptomatic cholelithiasis.  The patient was last seen on 10/10/2022.  She has been following up with Dr. Cathie Hoops due to iron deficiency anemia with a hemoglobin of 6.6 back on 10/07/2022.  Overall she has been doing very well her hemoglobin went up to 8.8 on 10/28/2022 and most recently up to 11.2 on 12/24/2022.  The patient feels a whole lot better from an energy standpoint now that her hemoglobin is much improved.  However she still having issues with her gallbladder.  She still having episodes of right upper quadrant and epigastric abdominal pain associated with nausea.  The episodes are happening more frequently and are particularly more associated if she eats greasy foods.  Denies any fevers or chills or any episodes of constant pain.  She has not needed to go to the emergency room for her pain.  Now her hemoglobin is improved, she is ready to proceed with surgery.  Past Medical History: Past Medical History:  Diagnosis Date   Blood transfusion without reported diagnosis    History of postpartum hemorrhage    History of preterm delivery, currently pregnant    History of seizures    as a child; unknown cause   Preterm labor    Seizures (HCC)    Thromboembolism during pregnancy      Past Surgical History: Past Surgical History:  Procedure Laterality Date   CESAREAN SECTION     x3   CESAREAN SECTION     CESAREAN SECTION     CESAREAN SECTION WITH BILATERAL TUBAL LIGATION Bilateral 10/14/2019   Procedure: CESAREAN SECTION WITH BILATERAL TUBAL LIGATION;  Surgeon: Catalina Antigua, MD;  Location: [facility] LD ORS;  Service: Obstetrics;  Laterality: Bilateral;   WISDOM TOOTH EXTRACTION      Home Medications: Prior to Admission medications   Medication Sig Start Date End Date Taking? Authorizing Provider  CALCIUM-VITAMIN D PO Take 1 tablet by mouth daily.   Yes [provider]   cholecalciferol (VITAMIN D3) 25 MCG (1000 UNIT) tablet Take 1,000 Units by mouth daily.   Yes [provider]  etonogestrel (NEXPLANON) 68 MG IMPL implant 1 each by Subdermal route once.   Yes [provider]  Ferrous Sulfate 27 MG TABS Take 1 tablet (27 mg total) by mouth 2 (two) times daily. 10/08/22  Yes Margaretann Loveless, MD  norethindrone (ORTHO MICRONOR) 0.35 MG tablet Take 1 tablet (0.35 mg total) by mouth daily. 01/17/23  Yes Dominica Severin, CNM  ondansetron (ZOFRAN) 4 MG tablet Take 1 tablet (4 mg total) by mouth every 8 (eight) hours as needed for nausea or vomiting. 10/07/22  Yes Margaretann Loveless, MD    Allergies: No Known Allergies  Review of Systems: Review of Systems  Constitutional:  Negative for chills and fever.  Respiratory:  Negative for shortness of breath.   Cardiovascular:  Negative for chest pain.  Gastrointestinal:  Positive for abdominal pain and nausea. Negative for constipation, diarrhea and vomiting.  Genitourinary:  Negative for dysuria.    Physical Exam BP 110/75 (BP Location: Left Arm, Patient Position: Sitting, Cuff Size: Large)   Pulse 89   Temp 98.5 F (36.9 C) (Oral)   Ht 5\' 8"  (1.727 m)   Wt 275 lb 3.2 oz (124.8 kg)   LMP 11/06/2022   SpO2 100%   BMI 41.84 kg/m  CONSTITUTIONAL: No acute distress HEENT:  Normocephalic, atraumatic, extraocular motion intact. RESPIRATORY:  Lungs are clear, and breath sounds are equal bilaterally. Normal respiratory effort without pathologic use of accessory muscles. CARDIOVASCULAR: Heart is regular without murmurs, gallops, or rubs. GI: The abdomen is soft, nondistended, with mild discomfort to palpation in the epigastric and right upper quadrant areas.  Negative Murphy's sign.  NEUROLOGIC:  Motor and sensation is grossly normal.  Cranial nerves are grossly intact. PSYCH:  Alert and oriented to person, place and time. Affect is normal.  Labs/Imaging: Labs from 12/24/2022: WBC 4.3, hemoglobin  11.2, hematocrit 37.1, platelets 243.  Assessment and Plan: This is a 32 y.o. female with symptomatic cholelithiasis.  - Discussed with patient and now that her hemoglobin is much improved, we can proceed with scheduling her for robotic assisted cholecystectomy.  She is in agreement.  The patient reports that she is on vacation next week.  I think we can tentatively schedule her for 02/18/2023. - Discussed with the patient the plan for robotic assisted cholecystectomy and reviewed the surgery at length with her including the planned incisions, risks of bleeding, infection, injury to surrounding structures, that this would be an outpatient procedure, the use of ICG to better evaluate the biliary anatomy, postoperative activity restrictions, pain control, and she is willing to proceed. - All of her questions have been answered.  I spent 30 minutes dedicated to the care of this patient on the date of this encounter to include pre-visit review of records, face-to-face time with the patient discussing diagnosis and management, and any post-visit coordination of care.   Howie Ill, MD [facility]

## 2023-02-12 NOTE — Patient Instructions (Signed)
You have requested to have your gallbladder removed. This will be done at [facility] with Dr. Piscoya.  You will most likely be out of work 1-2 weeks for this surgery.  If you have FMLA or disability paperwork that needs filled out you may drop this off at our office or this can be faxed to [phone].  You will return after your post-op appointment with a lifting restriction for approximately 4 more weeks.  You will be able to eat anything you would like to following surgery. But, start by eating a bland diet and advance this as tolerated. The Gallbladder diet is below, please go as closely by this diet as possible prior to surgery to avoid any further attacks.  Please see the (blue)pre-care form that you have been given today. Our surgery scheduler will call you to verify surgery date and to go over information.   If you have any questions, please call our office.  Laparoscopic Cholecystectomy Laparoscopic cholecystectomy is surgery to remove the gallbladder. The gallbladder is located in the upper right part of the abdomen, behind the liver. It is a storage sac for bile, which is produced in the liver. Bile aids in the digestion and absorption of fats. Cholecystectomy is often done for inflammation of the gallbladder (cholecystitis). This condition is usually caused by a buildup of gallstones (cholelithiasis) in the gallbladder. Gallstones can block the flow of bile, and that can result in inflammation and pain. In severe cases, emergency surgery may be required. If emergency surgery is not required, you will have time to prepare for the procedure. Laparoscopic surgery is an alternative to open surgery. Laparoscopic surgery has a shorter recovery time. Your common bile duct may also need to be examined during the procedure. If stones are found in the common bile duct, they may be removed. LET YOUR HEALTH CARE PROVIDER KNOW ABOUT: Any allergies you have. All medicines you are taking,  including vitamins, herbs, eye drops, creams, and over-the-counter medicines. Previous problems you or members of your family have had with the use of anesthetics. Any blood disorders you have. Previous surgeries you have had.  Any medical conditions you have. RISKS AND COMPLICATIONS Generally, this is a safe procedure. However, problems may occur, including: Infection. Bleeding. Allergic reactions to medicines. Damage to other structures or organs. A stone remaining in the common bile duct. A bile leak from the cyst duct that is clipped when your gallbladder is removed. The need to convert to open surgery, which requires a larger incision in the abdomen. This may be necessary if your surgeon thinks that it is not safe to continue with a laparoscopic procedure. BEFORE THE PROCEDURE Ask your health care provider about: Changing or stopping your regular medicines. This is especially important if you are taking diabetes medicines or blood thinners. Taking medicines such as aspirin and ibuprofen. These medicines can thin your blood. Do not take these medicines before your procedure if your health care provider instructs you not to. Follow instructions from your health care provider about eating or drinking restrictions. Let your health care provider know if you develop a cold or an infection before surgery. Plan to have someone take you home after the procedure. Ask your health care provider how your surgical site will be marked or identified. You may be given antibiotic medicine to help prevent infection. PROCEDURE To reduce your risk of infection: Your health care team will wash or sanitize their hands. Your skin will be washed with soap. An IV   tube may be inserted into one of your veins. You will be given a medicine to make you fall asleep (general anesthetic). A breathing tube will be placed in your mouth. The surgeon will make several small cuts (incisions) in your abdomen. A thin,  lighted tube (laparoscope) that has a tiny camera on the end will be inserted through one of the small incisions. The camera on the laparoscope will send a picture to a TV screen (monitor) in the operating room. This will give the surgeon a good view inside your abdomen. A gas will be pumped into your abdomen. This will expand your abdomen to give the surgeon more room to perform the surgery. Other tools that are needed for the procedure will be inserted through the other incisions. The gallbladder will be removed through one of the incisions. After your gallbladder has been removed, the incisions will be closed with stitches (sutures), staples, or skin glue. Your incisions may be covered with a bandage (dressing). The procedure may vary among health care providers and hospitals. AFTER THE PROCEDURE Your blood pressure, heart rate, breathing rate, and blood oxygen level will be monitored often until the medicines you were given have worn off. You will be given medicines as needed to control your pain.   This information is not intended to replace advice given to you by your health care provider. Make sure you discuss any questions you have with your health care provider.   Document Released: 04/08/2005 Document Revised: 12/28/2014 Document Reviewed: 11/18/2012 Elsevier Interactive Patient Education 2016 [place] Diet for Gallbladder Conditions A low-fat diet can be helpful if you have pancreatitis or a gallbladder condition. With these conditions, your pancreas and gallbladder have trouble digesting fats. A healthy eating plan with less fat will help rest your pancreas and gallbladder and reduce your symptoms. WHAT DO I NEED TO KNOW ABOUT THIS DIET? Eat a low-fat diet. Reduce your fat intake to less than 20-30% of your total daily calories. This is less than 50-60 g of fat per day. Remember that you need some fat in your diet. Ask your dietician what your daily goal should  be. Choose nonfat and low-fat healthy foods. Look for the words "nonfat," "low fat," or "fat free." As a guide, look on the label and choose foods with less than 3 g of fat per serving. Eat only one serving. Avoid alcohol. Do not smoke. If you need help quitting, talk with your health care provider. Eat small frequent meals instead of three large heavy meals. WHAT FOODS CAN I EAT? Grains Include healthy grains and starches such as potatoes, wheat bread, fiber-rich cereal, and brown rice. Choose whole grain options whenever possible. In adults, whole grains should account for 45-65% of your daily calories.  Fruits and Vegetables Eat plenty of fruits and vegetables. Fresh fruits and vegetables add fiber to your diet. Meats and Other Protein Sources Eat lean meat such as chicken and pork. Trim any fat off of meat before cooking it. Eggs, fish, and beans are other sources of protein. In adults, these foods should account for 10-35% of your daily calories. Dairy Choose low-fat milk and dairy options. Dairy includes fat and protein, as well as calcium.  Fats and Oils Limit high-fat foods such as fried foods, sweets, baked goods, sugary drinks.  Other Creamy sauces and condiments, such as mayonnaise, can add extra fat. Think about whether or not you need to use them, or use smaller amounts or low fat options.  WHAT FOODS ARE NOT RECOMMENDED? High fat foods, such as: [employer]. Ice cream. [place] toast. Sweet rolls. Pizza. Cheese bread. Foods covered with batter, butter, creamy sauces, or cheese. Fried foods. Sugary drinks and desserts. Foods that cause gas or bloating   This information is not intended to replace advice given to you by your health care provider. Make sure you discuss any questions you have with your health care provider.   Document Released: 04/13/2013 Document Reviewed: 04/13/2013 Elsevier Interactive Patient Education [employer].

## 2023-02-12 NOTE — Progress Notes (Unsigned)
02/12/2023  History of Present Illness: Sophia Olson is a 32 y.o. female presenting for follow-up of symptomatic cholelithiasis.  The patient was last seen on 10/10/2022.  She has been following up with Dr. Cathie Hoops due to iron deficiency anemia with a hemoglobin of 6.6 back on 10/07/2022.  Overall she has been doing very well her hemoglobin went up to 8.8 on 10/28/2022 and most recently up to 11.2 on 12/24/2022.  The patient feels a whole lot better from an energy standpoint now that her hemoglobin is much improved.  However she still having issues with her gallbladder.  She still having episodes of right upper quadrant and epigastric abdominal pain associated with nausea.  The episodes are happening more frequently and are particularly more associated if she eats greasy foods.  Denies any fevers or chills or any episodes of constant pain.  She has not needed to go to the emergency room for her pain.  Now her hemoglobin is improved, she is ready to proceed with surgery.  Past Medical History: Past Medical History:  Diagnosis Date   Blood transfusion without reported diagnosis    History of postpartum hemorrhage    History of preterm delivery, currently pregnant    History of seizures    as a child; unknown cause   Preterm labor    Seizures (HCC)    Thromboembolism during pregnancy      Past Surgical History: Past Surgical History:  Procedure Laterality Date   CESAREAN SECTION     x3   CESAREAN SECTION     CESAREAN SECTION     CESAREAN SECTION WITH BILATERAL TUBAL LIGATION Bilateral 10/14/2019   Procedure: CESAREAN SECTION WITH BILATERAL TUBAL LIGATION;  Surgeon: Catalina Antigua, MD;  Location: [facility] LD ORS;  Service: Obstetrics;  Laterality: Bilateral;   WISDOM TOOTH EXTRACTION      Home Medications: Prior to Admission medications   Medication Sig Start Date End Date Taking? Authorizing Provider  CALCIUM-VITAMIN D PO Take 1 tablet by mouth daily.   Yes [provider]   cholecalciferol (VITAMIN D3) 25 MCG (1000 UNIT) tablet Take 1,000 Units by mouth daily.   Yes [provider]  etonogestrel (NEXPLANON) 68 MG IMPL implant 1 each by Subdermal route once.   Yes [provider]  Ferrous Sulfate 27 MG TABS Take 1 tablet (27 mg total) by mouth 2 (two) times daily. 10/08/22  Yes Margaretann Loveless, MD  norethindrone (ORTHO MICRONOR) 0.35 MG tablet Take 1 tablet (0.35 mg total) by mouth daily. 01/17/23  Yes Dominica Severin, CNM  ondansetron (ZOFRAN) 4 MG tablet Take 1 tablet (4 mg total) by mouth every 8 (eight) hours as needed for nausea or vomiting. 10/07/22  Yes Margaretann Loveless, MD    Allergies: No Known Allergies  Review of Systems: Review of Systems  Constitutional:  Negative for chills and fever.  Respiratory:  Negative for shortness of breath.   Cardiovascular:  Negative for chest pain.  Gastrointestinal:  Positive for abdominal pain and nausea. Negative for constipation, diarrhea and vomiting.  Genitourinary:  Negative for dysuria.    Physical Exam BP 110/75 (BP Location: Left Arm, Patient Position: Sitting, Cuff Size: Large)   Pulse 89   Temp 98.5 F (36.9 C) (Oral)   Ht 5\' 8"  (1.727 m)   Wt 275 lb 3.2 oz (124.8 kg)   LMP 11/06/2022   SpO2 100%   BMI 41.84 kg/m  CONSTITUTIONAL: No acute distress HEENT:  Normocephalic, atraumatic, extraocular motion intact. RESPIRATORY:  Lungs are clear, and breath sounds are equal bilaterally. Normal respiratory effort without pathologic use of accessory muscles. CARDIOVASCULAR: Heart is regular without murmurs, gallops, or rubs. GI: The abdomen is soft, nondistended, with mild discomfort to palpation in the epigastric and right upper quadrant areas.  Negative Murphy's sign.  NEUROLOGIC:  Motor and sensation is grossly normal.  Cranial nerves are grossly intact. PSYCH:  Alert and oriented to person, place and time. Affect is normal.  Labs/Imaging: Labs from 12/24/2022: WBC 4.3, hemoglobin  11.2, hematocrit 37.1, platelets 243.  Assessment and Plan: This is a 32 y.o. female with symptomatic cholelithiasis.  - Discussed with patient and now that her hemoglobin is much improved, we can proceed with scheduling her for robotic assisted cholecystectomy.  She is in agreement.  The patient reports that she is on vacation next week.  I think we can tentatively schedule her for 02/18/2023. - Discussed with the patient the plan for robotic assisted cholecystectomy and reviewed the surgery at length with her including the planned incisions, risks of bleeding, infection, injury to surrounding structures, that this would be an outpatient procedure, the use of ICG to better evaluate the biliary anatomy, postoperative activity restrictions, pain control, and she is willing to proceed. - All of her questions have been answered.  I spent 30 minutes dedicated to the care of this patient on the date of this encounter to include pre-visit review of records, face-to-face time with the patient discussing diagnosis and management, and any post-visit coordination of care.   Howie Ill, MD [facility]

## 2023-02-13 ENCOUNTER — Telehealth: Payer: Self-pay | Admitting: Surgery

## 2023-02-13 NOTE — Telephone Encounter (Signed)
Patient has been advised of Pre-Admission date/time, and Surgery date at [facility].  Surgery Date: 02/18/23 Preadmission Testing Date: 02/14/23 (phone 8a-1p)  Patient has been made aware to call [phone], between 1-3:00pm the day before surgery, to find out what time to arrive for surgery.

## 2023-02-14 ENCOUNTER — Encounter
Admission: RE | Admit: 2023-02-14 | Discharge: 2023-02-14 | Disposition: A | Discharge status: Home / Self Care | Payer: No Typology Code available for payment source | Source: Ambulatory Visit | Attending: Surgery

## 2023-02-14 ENCOUNTER — Other Ambulatory Visit: Payer: Self-pay

## 2023-02-14 VITALS — Ht 68.0 in | Wt 275.0 lb

## 2023-02-14 DIAGNOSIS — Z01818 Encounter for other preprocedural examination: Secondary | ICD-10-CM

## 2023-02-14 HISTORY — DX: Anemia, unspecified: D64.9

## 2023-02-14 NOTE — Patient Instructions (Addendum)
Your procedure is scheduled on: Tuesday 02/18/23 To find out your arrival time, please call [phone] between 1PM - 3PM on:   Monday 02/17/23 Report to the Registration Desk on the 1st floor of the [facility]. Free Valet parking is available.  If your arrival time is 6:00 am, do not arrive before that time as the [facility] entrance doors do not open until 6:00 am.  REMEMBER: Instructions that are not followed completely may result in serious medical risk, up to and including death; or upon the discretion of your surgeon and anesthesiologist your surgery may need to be rescheduled.  Do not eat food after midnight the night before surgery.  No gum chewing or hard candies.  You may however, drink CLEAR liquids up to 2 hours before you are scheduled to arrive for your surgery. Do not drink anything within 2 hours of your scheduled arrival time.  Clear liquids include: - water  - apple juice without pulp - gatorade (not RED colors) - black coffee or tea (Do NOT add milk or creamers to the coffee or tea) Do NOT drink anything that is not on this list.  Type 1 and Type 2 diabetics should only drink water.  One week prior to surgery: Stop Anti-inflammatories (NSAIDS) such as Advil, Aleve, Ibuprofen, Motrin, Naproxen, Naprosyn and Aspirin based products such as Excedrin, Goody's Powder, BC Powder. You may however, continue to take Tylenol if needed for pain up until the day of surgery.  Stop ANY OVER THE COUNTER supplements or vitamins until after surgery. Hold your calcium and vitamin D today but continue your iron.  Continue taking all prescribed medications.   TAKE ONLY THESE MEDICATIONS THE MORNING OF SURGERY WITH A SIP OF WATER:  none  No Alcohol for 24 hours before or after surgery.  No Smoking including e-cigarettes for 24 hours before surgery.  No chewable tobacco products for at least 6 hours before surgery.  No nicotine patches on the day of surgery.  Do not use  any "recreational" drugs for at least a week (preferably 2 weeks) before your surgery.  Please be advised that the combination of cocaine and anesthesia may have negative outcomes, up to and including death. If you test positive for cocaine, your surgery will be cancelled.  On the morning of surgery brush your teeth with toothpaste and water, you may rinse your mouth with mouthwash if you wish. Do not swallow any toothpaste or mouthwash.  Use CHG Soap or wipes as directed on instruction sheet. You can pick this up at our office at [address], [address] Monday-Friday 8-4:30   Do not wear lotions, powders, or perfumes.   Do not shave body hair from the neck down 48 hours before surgery.  Wear clean comfortable clothing (specific to your surgery type) to the hospital.  Do not wear jewelry, make-up, hairpins, clips or nail polish.  For welded (permanent) jewelry: bracelets, anklets, waist bands, etc.  Please have this removed prior to surgery.  If it is not removed, there is a chance that hospital personnel will need to cut it off on the day of surgery. Contact lenses, hearing aids and dentures may not be worn into surgery.  Do not bring valuables to the hospital. [facility] is not responsible for any missing/lost belongings or valuables.   Notify your doctor if there is any change in your medical condition (cold, fever, infection).  If you are being discharged the day of surgery, you will not  be allowed to drive home. You will need a responsible individual to drive you home and stay with you for 24 hours after surgery.   If you are taking public transportation, you will need to have a responsible individual with you.  If you are being admitted to the hospital overnight, leave your suitcase in the car. After surgery it may be brought to your room.  In case of increased patient census, it may be necessary for you, the patient, to continue your postoperative care in the Same Day  Surgery department.  After surgery, you can help prevent lung complications by doing breathing exercises.  Take deep breaths and cough every 1-2 hours. Your doctor may order a device called an Incentive Spirometer to help you take deep breaths. When coughing or sneezing, hold a pillow firmly against your incision with both hands. This is called "splinting." Doing this helps protect your incision. It also decreases belly discomfort.  Surgery Visitation Policy:  Patients undergoing a surgery or procedure may have two family members or support persons with them as long as the person is not COVID-19 positive or experiencing its symptoms.   Inpatient Visitation:    Visiting hours are 7 a.m. to 8 p.m. Up to four visitors are allowed at one time in a patient room. The visitors may rotate out with other people during the day. One designated support person (adult) may remain overnight.  Please call the Pre-admissions Testing Dept. at [phone] if you have any questions about these instructions.     Preparing for Surgery with CHLORHEXIDINE GLUCONATE (CHG) Soap  Chlorhexidine Gluconate (CHG) Soap  o An antiseptic cleaner that kills germs and bonds with the skin to continue killing germs even after washing  o Used for showering the night before surgery and morning of surgery  Before surgery, you can play an important role by reducing the number of germs on your skin.  CHG (Chlorhexidine gluconate) soap is an antiseptic cleanser which kills germs and bonds with the skin to continue killing germs even after washing.  Please do not use if you have an allergy to CHG or antibacterial soaps. If your skin becomes reddened/irritated stop using the CHG.  1. Shower the NIGHT BEFORE SURGERY and the MORNING OF SURGERY with CHG soap.  2. If you choose to wash your hair, wash your hair first as usual with your normal shampoo.  3. After shampooing, rinse your hair and body thoroughly to remove the  shampoo.  4. Use CHG as you would any other liquid soap. You can apply CHG directly to the skin and wash gently with a scrungie or a clean washcloth.  5. Apply the CHG soap to your body only from the neck down. Do not use on open wounds or open sores. Avoid contact with your eyes, ears, mouth, and genitals (private parts). Wash face and genitals (private parts) with your normal soap.  6. Wash thoroughly, paying special attention to the area where your surgery will be performed.  7. Thoroughly rinse your body with warm water.  8. Do not shower/wash with your normal soap after using and rinsing off the CHG soap.  9. Pat yourself dry with a clean towel.  10. Wear clean pajamas to bed the night before surgery.  12. Place clean sheets on your bed the night of your first shower and do not sleep with pets.  13. Shower again with the CHG soap on the day of surgery prior to arriving at the hospital.  14. Do not apply any deodorants/lotions/powders.  15. Please wear clean clothes to the hospital.

## 2023-02-18 ENCOUNTER — Ambulatory Visit: Payer: No Typology Code available for payment source

## 2023-02-18 ENCOUNTER — Encounter: Payer: Self-pay | Admitting: Surgery

## 2023-02-18 ENCOUNTER — Ambulatory Visit
Admission: RE | Admit: 2023-02-18 | Discharge: 2023-02-18 | Disposition: A | Discharge status: Home / Self Care | Payer: No Typology Code available for payment source | Attending: Surgery | Admitting: Surgery

## 2023-02-18 ENCOUNTER — Encounter
Admission: RE | Disposition: A | Discharge status: Home / Self Care | Payer: Self-pay | Source: Home / Self Care | Attending: Surgery

## 2023-02-18 ENCOUNTER — Other Ambulatory Visit: Payer: Self-pay

## 2023-02-18 DIAGNOSIS — Z01818 Encounter for other preprocedural examination: Secondary | ICD-10-CM

## 2023-02-18 DIAGNOSIS — Z87891 Personal history of nicotine dependence: Secondary | ICD-10-CM | POA: Insufficient documentation

## 2023-02-18 DIAGNOSIS — Z6838 Body mass index (BMI) 38.0-38.9, adult: Secondary | ICD-10-CM | POA: Diagnosis not present

## 2023-02-18 DIAGNOSIS — K8010 Calculus of gallbladder with chronic cholecystitis without obstruction: Secondary | ICD-10-CM | POA: Diagnosis not present

## 2023-02-18 DIAGNOSIS — K8020 Calculus of gallbladder without cholecystitis without obstruction: Secondary | ICD-10-CM | POA: Diagnosis present

## 2023-02-18 DIAGNOSIS — D509 Iron deficiency anemia, unspecified: Secondary | ICD-10-CM | POA: Insufficient documentation

## 2023-02-18 DIAGNOSIS — Z86718 Personal history of other venous thrombosis and embolism: Secondary | ICD-10-CM | POA: Diagnosis not present

## 2023-02-18 LAB — POCT PREGNANCY, URINE: Preg Test, Ur: NEGATIVE

## 2023-02-18 SURGERY — CHOLECYSTECTOMY, ROBOT-ASSISTED, LAPAROSCOPIC
Anesthesia: General | Site: Abdomen

## 2023-02-18 MED ORDER — OXYCODONE HCL 5 MG PO TABS
ORAL_TABLET | ORAL | Status: AC
  Filled 2023-02-18: qty 1

## 2023-02-18 MED ORDER — DEXAMETHASONE SODIUM PHOSPHATE 10 MG/ML IJ SOLN
INTRAMUSCULAR | Status: DC | PRN
  Administered 2023-02-18: 10 mg via INTRAVENOUS

## 2023-02-18 MED ORDER — FENTANYL CITRATE (PF) 100 MCG/2ML IJ SOLN
INTRAMUSCULAR | Status: AC
  Filled 2023-02-18: qty 2

## 2023-02-18 MED ORDER — SUGAMMADEX SODIUM 200 MG/2ML IV SOLN
INTRAVENOUS | Status: DC | PRN
  Administered 2023-02-18: 200 mg via INTRAVENOUS

## 2023-02-18 MED ORDER — CHLORHEXIDINE GLUCONATE 0.12 % MT SOLN
15.0000 mL | Freq: Once | OROMUCOSAL | Status: AC
  Administered 2023-02-18: 15 mL via OROMUCOSAL

## 2023-02-18 MED ORDER — OXYCODONE HCL 5 MG PO TABS: 5.0000 mg | ORAL_TABLET | ORAL | 0 refills | Status: DC | PRN

## 2023-02-18 MED ORDER — ACETAMINOPHEN 500 MG PO TABS
ORAL_TABLET | ORAL | Status: AC
  Filled 2023-02-18: qty 2

## 2023-02-18 MED ORDER — GABAPENTIN 300 MG PO CAPS
300.0000 mg | ORAL_CAPSULE | ORAL | Status: DC
Start: 2023-02-19 — End: 2023-02-18

## 2023-02-18 MED ORDER — FENTANYL CITRATE (PF) 100 MCG/2ML IJ SOLN
INTRAMUSCULAR | Status: DC | PRN
  Administered 2023-02-18: 100 ug via INTRAVENOUS

## 2023-02-18 MED ORDER — ACETAMINOPHEN 500 MG PO TABS
1000.0000 mg | ORAL_TABLET | ORAL | Status: AC
  Administered 2023-02-18: 1000 mg via ORAL

## 2023-02-18 MED ORDER — DROPERIDOL 2.5 MG/ML IJ SOLN
0.6250 mg | Freq: Once | INTRAMUSCULAR | Status: AC | PRN
  Administered 2023-02-18: 0.625 mg via INTRAVENOUS

## 2023-02-18 MED ORDER — DROPERIDOL 2.5 MG/ML IJ SOLN
INTRAMUSCULAR | Status: AC
  Filled 2023-02-18: qty 2

## 2023-02-18 MED ORDER — BUPIVACAINE LIPOSOME 1.3 % IJ SUSP
INTRAMUSCULAR | Status: AC
  Filled 2023-02-18: qty 10

## 2023-02-18 MED ORDER — INDOCYANINE GREEN 25 MG IV SOLR
INTRAVENOUS | Status: AC
Start: 2023-02-18 — End: ?
  Filled 2023-02-18: qty 10

## 2023-02-18 MED ORDER — FENTANYL CITRATE (PF) 100 MCG/2ML IJ SOLN
25.0000 ug | INTRAMUSCULAR | Status: DC | PRN
Start: 2023-02-18 — End: 2023-02-18
  Administered 2023-02-18: 25 ug via INTRAVENOUS
  Administered 2023-02-18 (×2): 50 ug via INTRAVENOUS
  Administered 2023-02-18: 25 ug via INTRAVENOUS

## 2023-02-18 MED ORDER — ONDANSETRON HCL 4 MG PO TABS: 4.0000 mg | ORAL_TABLET | Freq: Three times a day (TID) | ORAL | 0 refills | Status: AC | PRN

## 2023-02-18 MED ORDER — ACETAMINOPHEN 500 MG PO TABS: 1000.0000 mg | ORAL_TABLET | Freq: Four times a day (QID) | ORAL | Status: AC | PRN

## 2023-02-18 MED ORDER — OXYCODONE HCL 5 MG PO TABS
5.0000 mg | ORAL_TABLET | Freq: Once | ORAL | Status: AC
  Administered 2023-02-18: 5 mg via ORAL

## 2023-02-18 MED ORDER — PROPOFOL 1000 MG/100ML IV EMUL
INTRAVENOUS | Status: AC
  Filled 2023-02-18: qty 100

## 2023-02-18 MED ORDER — ROCURONIUM BROMIDE 100 MG/10ML IV SOLN
INTRAVENOUS | Status: DC | PRN
  Administered 2023-02-18: 70 mg via INTRAVENOUS
  Administered 2023-02-18: 20 mg via INTRAVENOUS

## 2023-02-18 MED ORDER — CHLORHEXIDINE GLUCONATE CLOTH 2 % EX PADS: 6.0000 | MEDICATED_PAD | Freq: Once | CUTANEOUS | Status: DC

## 2023-02-18 MED ORDER — PROPOFOL 10 MG/ML IV BOLUS
INTRAVENOUS | Status: AC
  Filled 2023-02-18: qty 20

## 2023-02-18 MED ORDER — BUPIVACAINE-EPINEPHRINE (PF) 0.25% -1:200000 IJ SOLN
INTRAMUSCULAR | Status: DC | PRN
  Administered 2023-02-18: 40 mL

## 2023-02-18 MED ORDER — KETOROLAC TROMETHAMINE 30 MG/ML IJ SOLN
INTRAMUSCULAR | Status: DC | PRN
  Administered 2023-02-18: 30 mg via INTRAVENOUS

## 2023-02-18 MED ORDER — BUPIVACAINE-EPINEPHRINE (PF) 0.25% -1:200000 IJ SOLN
INTRAMUSCULAR | Status: AC
  Filled 2023-02-18: qty 30

## 2023-02-18 MED ORDER — ORAL CARE MOUTH RINSE: 15.0000 mL | Freq: Once | OROMUCOSAL | Status: AC

## 2023-02-18 MED ORDER — BUPIVACAINE LIPOSOME 1.3 % IJ SUSP: 20.0000 mL | Freq: Once | INTRAMUSCULAR | Status: DC

## 2023-02-18 MED ORDER — ONDANSETRON HCL 4 MG/2ML IJ SOLN
INTRAMUSCULAR | Status: DC | PRN
  Administered 2023-02-18: 4 mg via INTRAVENOUS

## 2023-02-18 MED ORDER — CEFAZOLIN IN SODIUM CHLORIDE 3-0.9 GM/100ML-% IV SOLN
3.0000 g | INTRAVENOUS | Status: AC
Start: 2023-02-18 — End: 2023-02-18
  Administered 2023-02-18: 3 g via INTRAVENOUS
  Filled 2023-02-18: qty 100

## 2023-02-18 MED ORDER — LIDOCAINE HCL (CARDIAC) PF 100 MG/5ML IV SOSY
PREFILLED_SYRINGE | INTRAVENOUS | Status: DC | PRN
  Administered 2023-02-18: 100 mg via INTRAVENOUS

## 2023-02-18 MED ORDER — PROPOFOL 10 MG/ML IV BOLUS
INTRAVENOUS | Status: DC | PRN
  Administered 2023-02-18: 150 mg via INTRAVENOUS

## 2023-02-18 MED ORDER — CHLORHEXIDINE GLUCONATE 0.12 % MT SOLN
OROMUCOSAL | Status: AC
  Filled 2023-02-18: qty 15

## 2023-02-18 MED ORDER — IBUPROFEN 600 MG PO TABS: 600.0000 mg | ORAL_TABLET | Freq: Three times a day (TID) | ORAL | 1 refills | Status: AC | PRN

## 2023-02-18 MED ORDER — CEFAZOLIN SODIUM-DEXTROSE 2-4 GM/100ML-% IV SOLN
INTRAVENOUS | Status: AC
  Filled 2023-02-18: qty 100

## 2023-02-18 MED ORDER — CEFAZOLIN IN SODIUM CHLORIDE 3-0.9 GM/100ML-% IV SOLN
3.0000 g | INTRAVENOUS | Status: DC
Start: 2023-02-19 — End: 2023-02-18
  Filled 2023-02-18: qty 100

## 2023-02-18 MED ORDER — GABAPENTIN 300 MG PO CAPS
ORAL_CAPSULE | ORAL | Status: AC
Start: 2023-02-18 — End: ?
  Filled 2023-02-18: qty 1

## 2023-02-18 MED ORDER — INDOCYANINE GREEN 25 MG IV SOLR
2.5000 mg | INTRAVENOUS | Status: AC
  Administered 2023-02-18: 2.5 mg via INTRAVENOUS

## 2023-02-18 MED ORDER — 0.9 % SODIUM CHLORIDE (POUR BTL) OPTIME
TOPICAL | Status: DC | PRN
  Administered 2023-02-18: 500 mL

## 2023-02-18 MED ORDER — LACTATED RINGERS IV SOLN: INTRAVENOUS | Status: DC

## 2023-02-18 MED ORDER — MIDAZOLAM HCL 2 MG/2ML IJ SOLN
INTRAMUSCULAR | Status: AC
  Filled 2023-02-18: qty 2

## 2023-02-18 MED ORDER — MIDAZOLAM HCL 2 MG/2ML IJ SOLN
INTRAMUSCULAR | Status: DC | PRN
  Administered 2023-02-18: 2 mg via INTRAVENOUS

## 2023-02-18 MED ORDER — FENTANYL CITRATE (PF) 100 MCG/2ML IJ SOLN
50.0000 ug | Freq: Once | INTRAMUSCULAR | Status: AC
  Administered 2023-02-18: 50 ug via INTRAVENOUS

## 2023-02-18 SURGICAL SUPPLY — 47 items
ADH SKN CLS APL DERMABOND .7 (GAUZE/BANDAGES/DRESSINGS) ×1
BAG PRESSURE INF REUSE 1000 (BAG) IMPLANT
CANNULA CAP OBTURATR AIRSEAL 8 (CAP) IMPLANT
CAUTERY HOOK MNPLR 1.6 DVNC XI (INSTRUMENTS) ×1 IMPLANT
CLIP LIGATING HEMO O LOK GREEN (MISCELLANEOUS) ×1 IMPLANT
DERMABOND ADVANCED .7 DNX12 (GAUZE/BANDAGES/DRESSINGS) ×1 IMPLANT
DRAPE ARM DVNC X/XI (DISPOSABLE) ×4 IMPLANT
DRAPE COLUMN DVNC XI (DISPOSABLE) ×1 IMPLANT
ELECT CAUTERY BLADE TIP 2.5 (TIP) ×1
ELECT REM PT RETURN 9FT ADLT (ELECTROSURGICAL) ×1
ELECTRODE CAUTERY BLDE TIP 2.5 (TIP) ×1 IMPLANT
ELECTRODE REM PT RTRN 9FT ADLT (ELECTROSURGICAL) ×1 IMPLANT
FORCEPS BPLR R/ABLATION 8 DVNC (INSTRUMENTS) ×1 IMPLANT
FORCEPS PROGRASP DVNC XI (FORCEP) ×1 IMPLANT
GLOVE SURG SYN 7.0 (GLOVE) ×3
GLOVE SURG SYN 7.0 PF PI (GLOVE) ×2 IMPLANT
GLOVE SURG SYN 7.5 E (GLOVE) ×3
GLOVE SURG SYN 7.5 PF PI (GLOVE) ×2 IMPLANT
GOWN STRL REUS W/ TWL LRG LVL3 (GOWN DISPOSABLE) ×4 IMPLANT
GOWN STRL REUS W/TWL LRG LVL3 (GOWN DISPOSABLE) ×3
IRRIGATOR SUCT 8 DISP DVNC XI (IRRIGATION / IRRIGATOR) IMPLANT
IV NS 1000ML (IV SOLUTION)
IV NS 1000ML BAXH (IV SOLUTION) IMPLANT
KIT PINK PAD W/HEAD ARE REST (MISCELLANEOUS) ×1
KIT PINK PAD W/HEAD ARM REST (MISCELLANEOUS) ×1 IMPLANT
LABEL OR SOLS (LABEL) ×1 IMPLANT
MANIFOLD NEPTUNE II (INSTRUMENTS) ×1 IMPLANT
NDL HYPO 22X1.5 SAFETY MO (MISCELLANEOUS) ×1 IMPLANT
NEEDLE HYPO 22X1.5 SAFETY MO (MISCELLANEOUS) ×1
NS IRRIG 500ML POUR BTL (IV SOLUTION) ×1 IMPLANT
OBTURATOR OPTICAL STND 8 DVNC (TROCAR) ×1
OBTURATOR OPTICALSTD 8 DVNC (TROCAR) ×1 IMPLANT
PACK LAP CHOLECYSTECTOMY (MISCELLANEOUS) ×1 IMPLANT
PENCIL SMOKE EVACUATOR (MISCELLANEOUS) ×1 IMPLANT
SEAL UNIV 5-12 XI (MISCELLANEOUS) ×4 IMPLANT
SET TUBE FILTERED XL AIRSEAL (SET/KITS/TRAYS/PACK) IMPLANT
SET TUBE SMOKE EVAC HIGH FLOW (TUBING) ×1 IMPLANT
SOL ELECTROSURG ANTI STICK (MISCELLANEOUS) ×1
SOLUTION ELECTROSURG ANTI STCK (MISCELLANEOUS) ×1 IMPLANT
SPIKE FLUID TRANSFER (MISCELLANEOUS) ×1 IMPLANT
SUT MNCRL AB 4-0 PS2 18 (SUTURE) ×1 IMPLANT
SUT VIC AB 3-0 SH 27 (SUTURE) ×1
SUT VIC AB 3-0 SH 27X BRD (SUTURE) IMPLANT
SUT VICRYL 0 UR6 27IN ABS (SUTURE) ×2 IMPLANT
SYS BAG RETRIEVAL 10MM (BASKET) ×1
SYSTEM BAG RETRIEVAL 10MM (BASKET) ×1 IMPLANT
WATER STERILE IRR 500ML POUR (IV SOLUTION) ×1 IMPLANT

## 2023-02-18 NOTE — Anesthesia Procedure Notes (Signed)
Procedure Name: Intubation Date/Time: 02/18/2023 1:43 PM  Performed by: Lenard Simmer, MDPre-anesthesia Checklist: Patient identified, Emergency Drugs available, Suction available and Patient being monitored Patient Re-evaluated:Patient Re-evaluated prior to induction Oxygen Delivery Method: Circle system utilized Preoxygenation: Pre-oxygenation with 100% oxygen Induction Type: IV induction Ventilation: Mask ventilation without difficulty Laryngoscope Size: McGraph and 3 Grade View: Grade I Tube type: Oral Tube size: 7.0 mm Number of attempts: 1 Airway Equipment and Method: Stylet and Video-laryngoscopy Placement Confirmation: ETT inserted through vocal cords under direct vision, positive ETCO2 and breath sounds checked- equal and bilateral Secured at: 22 cm Tube secured with: Tape Dental Injury: Teeth and Oropharynx as per pre-operative assessment  Comments: Performed under supervision of CRNA and MDA

## 2023-02-18 NOTE — Transfer of Care (Signed)
Immediate Anesthesia Transfer of Care Note  Patient: Sophia Olson  Procedure(s) Performed: XI ROBOTIC ASSISTED LAPAROSCOPIC CHOLECYSTECTOMY (Abdomen) INDOCYANINE GREEN FLUORESCENCE IMAGING (ICG) (Abdomen)  Patient Location: PACU  Anesthesia Type:General  Level of Consciousness: drowsy and patient cooperative  Airway & Oxygen Therapy: Patient Spontanous Breathing  Post-op Assessment: Report given to RN and Post -op Vital signs reviewed and stable  Post vital signs: stable  Last Vitals:  Vitals Value Taken Time  BP    Temp    Pulse    Resp    SpO2      Last Pain:  Vitals:   02/18/23 1231  TempSrc: Oral         Complications: No notable events documented.

## 2023-02-18 NOTE — Interval H&P Note (Signed)
History and Physical Interval Note:  02/18/2023 12:57 PM  Sophia Olson  has presented today for surgery, with the diagnosis of symptomatic cholelithiasis.  The various methods of treatment have been discussed with the patient and family. After consideration of risks, benefits and other options for treatment, the patient has consented to  Procedure(s): XI ROBOTIC ASSISTED LAPAROSCOPIC CHOLECYSTECTOMY (N/A) INDOCYANINE GREEN FLUORESCENCE IMAGING (ICG) (N/A) as a surgical intervention.  The patient's history has been reviewed, patient examined, no change in status, stable for surgery.  I have reviewed the patient's chart and labs.  Questions were answered to the patient's satisfaction.     Malcolm Quast

## 2023-02-18 NOTE — Op Note (Signed)
  Procedure Date:  02/18/2023  Pre-operative Diagnosis:  Symptomatic cholelithiasis  Post-operative Diagnosis: Symptomatic cholelithiasis  Procedure:  Robotic assisted cholecystectomy with ICG FireFly cholangiogram  Surgeon:  Howie Ill, MD  Anesthesia:  General endotracheal  Estimated Blood Loss:  15 ml  Specimens:  gallbladder  Complications:  None  Indications for Procedure:  This is a 32 y.o. female who presents with abdominal pain and workup revealing symptomatic cholelithiasis.  Due to iron deficiency anemia, her surgery was deferred.  Now her Hgb is much improved and she presents for cholecystectomy.  The benefits, complications, treatment options, and expected outcomes were discussed with the patient. The risks of bleeding, infection, recurrence of symptoms, failure to resolve symptoms, bile duct damage, bile duct leak, retained common bile duct stone, bowel injury, and need for further procedures were all discussed with the patient and she was willing to proceed.  Description of Procedure: The patient was correctly identified in the preoperative area and brought into the operating room.  The patient was placed supine with VTE prophylaxis in place.  Appropriate time-outs were performed.  Anesthesia was induced and the patient was intubated.  Appropriate antibiotics were infused.  The abdomen was prepped and draped in a sterile fashion. An infraumbilical incision was made. A cutdown technique was used to enter the abdominal cavity without injury, and a 12 mm robotic port was inserted.  Pneumoperitoneum was obtained with appropriate opening pressures.  Three 8-mm ports were placed in the mid abdomen at the level of the umbilicus under direct visualization.  The DaVinci platform was docked, camera targeted, and instruments were placed under direct visualization.  The gallbladder was identified.  The fundus was grasped and retracted cephalad.  Adhesions were lysed bluntly and with  electrocautery. The infundibulum was grasped and retracted laterally, exposing the peritoneum overlying the gallbladder.  This was incised with electrocautery and extended on either side of the gallbladder.  FireFly cholangiogram was then obtained, and we were able to clearly identify the cystic duct and common bile duct.  The cystic duct and cystic artery were carefully dissected with combination of cautery and blunt dissection.  There was some scarring at the level of the cystic artery, but this was carefully dissected without injury to the hepatic artery.  Both duct and artery were clipped twice proximally and once distally, cutting in between.  The gallbladder was taken from the gallbladder fossa in a retrograde fashion with electrocautery. The gallbladder was placed in an Endocatch bag. The liver bed was inspected and any bleeding was controlled with electrocautery. The right upper quadrant was then inspected again revealing intact clips, no bleeding, and no ductal injury.    The 8 mm ports were removed under direct visualization and the 12 mm port was removed.  The Endocatch bag was brought out via the umbilical incision. The fascial opening was closed using 0 vicryl suture.  Local anesthetic was infused in all incisions and the incisions were closed with 4-0 Monocryl.  The wounds were cleaned and sealed with DermaBond.  The patient was emerged from anesthesia and extubated and brought to the recovery room for further management.  The patient tolerated the procedure well and all counts were correct at the end of the case.   Howie Ill, MD

## 2023-02-18 NOTE — Discharge Instructions (Addendum)
Discharge Instructions: 1.  Patient may shower, but do not scrub wounds heavily and dab dry only. 2.  Do not submerge wounds in pool/tub until fully healed. 3.  Do not apply ointments or hydrogen peroxide to the wounds. 4.  May apply ice packs to the wounds for comfort. 5.  Do not drive while taking narcotics for pain control.  Prior to driving, make sure you are able to rotate right and left to look at blindspots without significant pain or discomfort. 6.  No heavy lifting or pushing of more than 10-15 lbs for 4 weeks.   AMBULATORY SURGERY  DISCHARGE INSTRUCTIONS   The drugs that you were given will stay in your system until tomorrow so for the next 24 hours you should not:  Drive an automobile Make any legal decisions Drink any alcoholic beverage   You may resume regular meals tomorrow.  Today it is better to start with liquids and gradually work up to solid foods.  You may eat anything you prefer, but it is better to start with liquids, then soup and crackers, and gradually work up to solid foods.   Please notify your doctor immediately if you have any unusual bleeding, trouble breathing, redness and pain at the surgery site, drainage, fever, or pain not relieved by medication.    Additional Instructions:   Please contact your physician with any problems or Same Day Surgery at [phone], Monday through Friday 6 am to 4 pm, or [facility] Main number at [phone].  

## 2023-02-18 NOTE — Anesthesia Preprocedure Evaluation (Signed)
Anesthesia Evaluation  Patient identified by MRN, date of birth, ID band Patient awake    Reviewed: Allergy & Precautions, H&P , NPO status , Patient's Chart, lab work & pertinent test results, reviewed documented beta blocker date and time   History of Anesthesia Complications Negative for: history of anesthetic complications  Airway Mallampati: III  TM Distance: >3 FB Neck ROM: full    Dental  (+) Teeth Intact, Dental Advidsory Given   Pulmonary neg shortness of breath, Continuous Positive Airway Pressure Ventilation neg sleep apnea, neg COPD, neg recent URI, former smoker   Pulmonary exam normal breath sounds clear to auscultation       Cardiovascular Exercise Tolerance: Good negative cardio ROS Normal cardiovascular exam Rhythm:regular Rate:Normal     Neuro/Psych Seizures - (as a child), Well Controlled,   negative psych ROS   GI/Hepatic negative GI ROS, Neg liver ROS,,,  Endo/Other  neg diabetes  Morbid obesity  Renal/GU negative Renal ROS  negative genitourinary   Musculoskeletal   Abdominal   Peds  Hematology  (+) Blood dyscrasia, anemia   Anesthesia Other Findings Past Medical History: No date: Anemia No date: Blood transfusion without reported diagnosis No date: History of postpartum hemorrhage No date: History of preterm delivery, currently pregnant No date: History of seizures     Comment:  as a child; unknown cause No date: Preterm labor No date: Seizures (HCC) No date: Thromboembolism during pregnancy   Reproductive/Obstetrics negative OB ROS                             Anesthesia Physical Anesthesia Plan  ASA: 3  Anesthesia Plan: General   Post-op Pain Management:    Induction: Intravenous  PONV Risk Score and Plan: 3 and Ondansetron, Dexamethasone, Midazolam and Treatment may vary due to age or medical condition  Airway Management Planned: Oral  ETT  Additional Equipment:   Intra-op Plan:   Post-operative Plan: Extubation in OR  Informed Consent: I have reviewed the patients History and Physical, chart, labs and discussed the procedure including the risks, benefits and alternatives for the proposed anesthesia with the patient or authorized representative who has indicated his/her understanding and acceptance.     Dental Advisory Given  Plan Discussed with: Anesthesiologist, CRNA and Surgeon  Anesthesia Plan Comments:        Anesthesia Quick Evaluation

## 2023-02-19 LAB — SURGICAL PATHOLOGY

## 2023-02-24 ENCOUNTER — Encounter: Payer: Self-pay | Admitting: Certified Nurse Midwife

## 2023-02-24 NOTE — Anesthesia Postprocedure Evaluation (Signed)
Anesthesia Post Note  Patient: Sophia Olson  Procedure(s) Performed: XI ROBOTIC ASSISTED LAPAROSCOPIC CHOLECYSTECTOMY (Abdomen) INDOCYANINE GREEN FLUORESCENCE IMAGING (ICG) (Abdomen)  Patient location during evaluation: PACU Anesthesia Type: General Level of consciousness: awake and alert Pain management: pain level controlled Vital Signs Assessment: post-procedure vital signs reviewed and stable Respiratory status: spontaneous breathing, nonlabored ventilation, respiratory function stable and patient connected to nasal cannula oxygen Cardiovascular status: blood pressure returned to baseline and stable Postop Assessment: no apparent nausea or vomiting Anesthetic complications: no   There were no known notable events for this encounter.   Last Vitals:  Vitals:   02/18/23 1650 02/18/23 1707  BP:  111/74  Pulse: 61 62  Resp:  16  Temp:  (!) 35.8 C  SpO2: 100% 98%    Last Pain:  Vitals:   02/18/23 1707  TempSrc: Temporal  PainSc: 2                  Lenard Simmer

## 2023-02-27 ENCOUNTER — Ambulatory Visit: Payer: No Typology Code available for payment source | Admitting: Certified Nurse Midwife

## 2023-02-28 ENCOUNTER — Ambulatory Visit (INDEPENDENT_AMBULATORY_CARE_PROVIDER_SITE_OTHER): Payer: No Typology Code available for payment source | Admitting: Certified Nurse Midwife

## 2023-02-28 ENCOUNTER — Encounter: Payer: Self-pay | Admitting: Certified Nurse Midwife

## 2023-02-28 VITALS — BP 106/75 | HR 78 | Ht 68.0 in | Wt 282.0 lb

## 2023-02-28 DIAGNOSIS — Z3046 Encounter for surveillance of implantable subdermal contraceptive: Secondary | ICD-10-CM

## 2023-02-28 MED ORDER — NORETHINDRONE 0.35 MG PO TABS: 1.0000 | ORAL_TABLET | Freq: Every day | ORAL | 3 refills | Status: AC

## 2023-02-28 NOTE — Progress Notes (Signed)
    GYNECOLOGY PROCEDURE NOTE  Nexplanon removal discussed in detail.  Risks of infection, bleeding, nerve injury all reviewed.  Patient understands risks and desires to proceed.  Verbal consent obtained.  Patient is certain she wants the Nexplanon removed.  All questions answered.  Review of Systems  Constitutional: Negative.   Respiratory: Negative.    Cardiovascular: Negative.   Genitourinary:        Daily spotting/bleeding despite adding progesterone only pills last month    Vital Signs: BP 106/75   Pulse 78   Ht 5\' 8"  (1.727 m)   Wt 282 lb (127.9 kg)   LMP  (LMP Unknown)   BMI 42.88 kg/m  Constitutional: Well nourished, well developed female in no acute distress.  Skin: Warm and dry.  Cardiovascular: Regular rate Respiratory:  Normal respiratory effort Psych: Alert and Oriented x3. No memory deficits. Normal mood and affect.    Procedure: Patient placed in dorsal supine with left arm above head, elbow flexed at 90 degrees, arm resting on examination table.  Nexplanon identified without problems.  Betadine scrub x3.  1 ml of 1% lidocaine injected under Nexplanon device without problems.  Sterile gloves applied.  Small 0.5cm incision made at distal tip of Nexplanon device with 11 blade scalpel.  Nexplanon brought to incision and grasped with a small kelly clamp.  Nexplanon removed intact without problems.  Pressure applied to incision.  Hemostasis obtained.  Steri-strips applied, followed by bandage and compression dressing.  Patient tolerated procedure well.  No complications.   Assessment: 32 y.o. year old female now s/p uncomplicated Nexplanon removal.  Plan: 1)  Patient given post procedure precautions and asked to call for fever, chills, redness or drainage from her incision, bleeding from incision.  She understands she will likely have a small bruise near site of removal and can remove bandage tomorrow and steri-strips in approximately 1 week.  2) Contraception:  BTL  3) Menorrhagia with anemia: continue progestin only pills, consider Mirena placement. Consider consult to discuss ablation   Dominica Severin, CNM

## 2023-03-04 ENCOUNTER — Ambulatory Visit: Payer: No Typology Code available for payment source | Admitting: Dietician

## 2023-03-05 ENCOUNTER — Ambulatory Visit: Payer: No Typology Code available for payment source | Admitting: Physician Assistant

## 2023-03-05 ENCOUNTER — Encounter: Payer: Self-pay | Admitting: Physician Assistant

## 2023-03-05 VITALS — BP 116/70 | HR 98 | Temp 98.0°F | Ht 68.0 in | Wt 280.0 lb

## 2023-03-05 DIAGNOSIS — K8020 Calculus of gallbladder without cholecystitis without obstruction: Secondary | ICD-10-CM

## 2023-03-05 DIAGNOSIS — Z09 Encounter for follow-up examination after completed treatment for conditions other than malignant neoplasm: Secondary | ICD-10-CM

## 2023-03-05 NOTE — Patient Instructions (Addendum)
May use Miralax 2 times a day until you have a bowel movement then may reduce to once a day for any constipation.   GENERAL POST-OPERATIVE PATIENT INSTRUCTIONS   WOUND CARE INSTRUCTIONS:  Keep a dry clean dressing on the wound if there is drainage. The initial bandage may be removed after 24 hours.  Once the wound has quit draining you may leave it open to air.  If clothing rubs against the wound or causes irritation and the wound is not draining you may cover it with a dry dressing during the daytime.  Try to keep the wound dry and avoid ointments on the wound unless directed to do so.  If the wound becomes bright red and painful or starts to drain infected material that is not clear, please contact your physician immediately.  If the wound is mildly pink and has a thick firm ridge underneath it, this is normal, and is referred to as a healing ridge.  This will resolve over the next 4-6 weeks.  BATHING: You may shower if you have been informed of this by your surgeon. However, Please do not submerge in a tub, hot tub, or pool until incisions are completely sealed or have been told by your surgeon that you may do so.  DIET:  You may eat any foods that you can tolerate.  It is a good idea to eat a high fiber diet and take in plenty of fluids to prevent constipation.  If you do become constipated you may want to take a mild laxative or take ducolax tablets on a daily basis until your bowel habits are regular.  Constipation can be very uncomfortable, along with straining, after recent surgery.  ACTIVITY:  You are encouraged to cough and deep breath or use your incentive spirometer if you were given one, every 15-30 minutes when awake.  This will help prevent respiratory complications and low grade fevers post-operatively if you had a general anesthetic.  You may want to hug a pillow when coughing and sneezing to add additional support to the surgical area, if you had abdominal or chest surgery, which will  decrease pain during these times.  You are encouraged to walk and engage in light activity for the next two weeks.  You should not lift more than 20 pounds for 6 weeks total after surgery as it could put you at increased risk for complications.  Twenty pounds is roughly equivalent to a plastic bag of groceries. At that time- Listen to your body when lifting, if you have pain when lifting, stop and then try again in a few days. Soreness after doing exercises or activities of daily living is normal as you get back in to your normal routine.  MEDICATIONS:  Try to take narcotic medications and anti-inflammatory medications, such as tylenol, ibuprofen, naprosyn, etc., with food.  This will minimize stomach upset from the medication.  Should you develop nausea and vomiting from the pain medication, or develop a rash, please discontinue the medication and contact your physician.  You should not drive, make important decisions, or operate machinery when taking narcotic pain medication.  SUNBLOCK Use sun block to incision area over the next year if this area will be exposed to sun. This helps decrease scarring and will allow you avoid a permanent darkened area over your incision.  QUESTIONS:  Please feel free to call our office if you have any questions, and we will be glad to assist you. [phone]

## 2023-03-05 NOTE — Progress Notes (Signed)
[facility] POST-OP OFFICE VISIT  03/05/2023  HPI: Sophia Olson is a 32 y.o. female 15 days s/p robotic assisted laparoscopic cholecystectomy for symptomatic cholelithiasis with Dr Aleen Campi   She is doing well Only minimal soreness at umbilicus; using ibuprofen and tylenol No fever, chills, nausea, emesis She is constipated; had been using the narcotic pain medication regularly No issues with PO intake Incisions are doing well Ambulating without issue No other complaints   Vital signs: BP 116/70   Pulse 98   Temp 98 F (36.7 C)   Ht 5\' 8"  (1.727 m)   Wt 280 lb (127 kg)   LMP 01/20/2023 (Exact Date)   SpO2 99%   BMI 42.57 kg/m    Physical Exam: Constitutional: Well appearing female, NAD Abdomen: Soft, non-tender, non-distended, no rebound/guarding Skin: Laparoscopic incisions are healing well, no erythema or drainage   Assessment/Plan: This is a 32 y.o. female 15 days s/p robotic assisted laparoscopic cholecystectomy for symptomatic cholelithiasis with Dr Aleen Campi    - Pain control prn  - Reviewed wound care recommendation  - Reviewed lifting restrictions; 4 weeks total  - Reviewed surgical pathology; CCC  - She can follow up on as needed basis; She understands to call with questions/concerns  -- Lynden Oxford, PA-C [facility] 03/05/2023, 2:17 PM M-F: 7am - 4pm

## 2023-03-18 ENCOUNTER — Ambulatory Visit: Payer: No Typology Code available for payment source | Admitting: Podiatry

## 2023-03-19 ENCOUNTER — Ambulatory Visit: Payer: No Typology Code available for payment source | Admitting: Dietician

## 2023-04-28 ENCOUNTER — Encounter: Payer: Self-pay | Admitting: Oncology

## 2023-05-05 ENCOUNTER — Ambulatory Visit: Payer: No Typology Code available for payment source | Admitting: Dietician

## 2023-05-19 ENCOUNTER — Inpatient Hospital Stay: Payer: Medicaid Other | Attending: Oncology

## 2023-05-19 ENCOUNTER — Inpatient Hospital Stay: Payer: Medicaid Other

## 2023-05-19 DIAGNOSIS — N920 Excessive and frequent menstruation with regular cycle: Secondary | ICD-10-CM | POA: Insufficient documentation

## 2023-05-19 DIAGNOSIS — D509 Iron deficiency anemia, unspecified: Secondary | ICD-10-CM | POA: Diagnosis present

## 2023-05-19 DIAGNOSIS — D500 Iron deficiency anemia secondary to blood loss (chronic): Secondary | ICD-10-CM

## 2023-05-19 LAB — IRON AND TIBC
Iron: 33 ug/dL (ref 28–170)
Saturation Ratios: 9 % — ABNORMAL LOW (ref 10.4–31.8)
TIBC: 379 ug/dL (ref 250–450)
UIBC: 346 ug/dL

## 2023-05-19 LAB — CBC WITH DIFFERENTIAL (CANCER CENTER ONLY)
Abs Immature Granulocytes: 0.01 10*3/uL (ref 0.00–0.07)
Basophils Absolute: 0 10*3/uL (ref 0.0–0.1)
Basophils Relative: 0 %
Eosinophils Absolute: 0.1 10*3/uL (ref 0.0–0.5)
Eosinophils Relative: 1 %
HCT: 33.5 % — ABNORMAL LOW (ref 36.0–46.0)
Hemoglobin: 10.7 g/dL — ABNORMAL LOW (ref 12.0–15.0)
Immature Granulocytes: 0 %
Lymphocytes Relative: 47 %
Lymphs Abs: 2.5 10*3/uL (ref 0.7–4.0)
MCH: 25.7 pg — ABNORMAL LOW (ref 26.0–34.0)
MCHC: 31.9 g/dL (ref 30.0–36.0)
MCV: 80.5 fL (ref 80.0–100.0)
Monocytes Absolute: 0.3 10*3/uL (ref 0.1–1.0)
Monocytes Relative: 6 %
Neutro Abs: 2.5 10*3/uL (ref 1.7–7.7)
Neutrophils Relative %: 46 %
Platelet Count: 253 10*3/uL (ref 150–400)
RBC: 4.16 MIL/uL (ref 3.87–5.11)
RDW: 13.4 % (ref 11.5–15.5)
WBC Count: 5.5 10*3/uL (ref 4.0–10.5)
nRBC: 0 % (ref 0.0–0.2)

## 2023-05-19 LAB — RETIC PANEL
Immature Retic Fract: 8.8 % (ref 2.3–15.9)
RBC.: 4.19 MIL/uL (ref 3.87–5.11)
Retic Count, Absolute: 54.1 10*3/uL (ref 19.0–186.0)
Retic Ct Pct: 1.3 % (ref 0.4–3.1)
Reticulocyte Hemoglobin: 26.4 pg — ABNORMAL LOW (ref >27.9)

## 2023-05-19 LAB — FERRITIN: Ferritin: 8 ng/mL — ABNORMAL LOW (ref 11–307)

## 2023-05-20 ENCOUNTER — Encounter: Payer: Self-pay | Admitting: Oncology

## 2023-05-20 ENCOUNTER — Inpatient Hospital Stay: Payer: Medicaid Other

## 2023-05-20 ENCOUNTER — Inpatient Hospital Stay (HOSPITAL_BASED_OUTPATIENT_CLINIC_OR_DEPARTMENT_OTHER): Payer: Medicaid Other | Admitting: Oncology

## 2023-05-20 VITALS — BP 103/71 | HR 86 | Temp 96.7°F | Resp 18 | Wt 302.4 lb

## 2023-05-20 VITALS — BP 104/69

## 2023-05-20 DIAGNOSIS — D500 Iron deficiency anemia secondary to blood loss (chronic): Secondary | ICD-10-CM | POA: Diagnosis not present

## 2023-05-20 DIAGNOSIS — N920 Excessive and frequent menstruation with regular cycle: Secondary | ICD-10-CM | POA: Diagnosis not present

## 2023-05-20 DIAGNOSIS — D509 Iron deficiency anemia, unspecified: Secondary | ICD-10-CM

## 2023-05-20 MED ORDER — IRON SUCROSE 20 MG/ML IV SOLN
200.0000 mg | Freq: Once | INTRAVENOUS | Status: AC
Start: 2023-05-20 — End: 2023-05-20
  Administered 2023-05-20: 200 mg via INTRAVENOUS

## 2023-05-20 NOTE — Assessment & Plan Note (Addendum)
Labs are reviewed and discussed with patient. Lab Results  Component Value Date   HGB 10.7 (L) 05/19/2023   TIBC 379 05/19/2023   IRONPCTSAT 9 (L) 05/19/2023   FERRITIN 8 (L) 05/19/2023  Recommend IV Venofer weekly x 4 She has history of tubal ligation-no chance of pregnancy.

## 2023-05-20 NOTE — Patient Instructions (Signed)

## 2023-05-20 NOTE — Assessment & Plan Note (Signed)
Recommend patient to discuss with gynecology for further workup and management.

## 2023-05-20 NOTE — Progress Notes (Signed)
Hematology/Oncology Consult note [phone] Fax:([phone]) [phone]      Patient Care Team: Patient, No Pcp Per as PCP - General (General Practice) Rickard Patience, MD as Consulting Physician (Oncology)    CHIEF COMPLAINTS/REASON FOR VISIT:  Iron deficiency anemia  ASSESSMENT & PLAN:  IDA (iron deficiency anemia) Labs are reviewed and discussed with patient. Lab Results  Component Value Date   HGB 10.7 (L) 05/19/2023   TIBC 379 05/19/2023   IRONPCTSAT 9 (L) 05/19/2023   FERRITIN 8 (L) 05/19/2023  Recommend IV Venofer weekly x 4 She has history of tubal ligation-no chance of pregnancy.  Menorrhagia Recommend patient to discuss with gynecology for further workup and management.  Orders Placed This Encounter  Procedures   CBC with Differential (Cancer Center Only)    Standing Status:   Future    Expected Date:   08/18/2023    Expiration Date:   05/19/2024   Iron and TIBC    Standing Status:   Future    Expected Date:   08/18/2023    Expiration Date:   05/19/2024   Ferritin    Standing Status:   Future    Expected Date:   08/18/2023    Expiration Date:   05/19/2024   Follow-up in 4 months All questions were answered. The patient knows to call the clinic with any problems, questions or concerns.  Rickard Patience, MD, PhD [facility] Hematology Oncology 05/20/2023     HISTORY OF PRESENTING ILLNESS:  Sophia Olson is a  33 y.o.  female with PMH listed below who was referred to me for anemia Reviewed patient's recent labs that was done.  Patient has a history of chronic anemia. Recently CBC on 10/07/2022 showed hemoglobin of 6.6, MCV 65, normal platelet count and white count. Iron panel showed ferritin of 3, iron saturation 3, TIBC 393. Patient reports feeling tired and slightly lightheaded. She has heavy menstrual period.  She has upcoming appointment with gynecology. She has started on oral iron supplementation for a few days. She denies recent chest pain on exertion,  shortness of breath on minimal exertion, pre-syncopal episodes, or palpitations She had not noticed any recent bleeding such as epistaxis, hematuria or hematochezia.    INTERVAL HISTORY Sophia Olson is a 33 y.o. female who has above history reviewed by me today presents for follow up visit for iron deficiency anemia.  She tolerates IV Venofer treatments.  Fatigue has improved.  MEDICAL HISTORY:  Past Medical History:  Diagnosis Date   Anemia    Blood transfusion without reported diagnosis    History of postpartum hemorrhage    History of preterm delivery, currently pregnant    History of seizures    as a child; unknown cause   Preterm labor    Seizures (HCC)    Thromboembolism during pregnancy     SURGICAL HISTORY: Past Surgical History:  Procedure Laterality Date   CESAREAN SECTION     x3   CESAREAN SECTION     CESAREAN SECTION     CESAREAN SECTION WITH BILATERAL TUBAL LIGATION Bilateral 10/14/2019   Procedure: CESAREAN SECTION WITH BILATERAL TUBAL LIGATION;  Surgeon: Catalina Antigua, MD;  Location: [facility] LD ORS;  Service: Obstetrics;  Laterality: Bilateral;   WISDOM TOOTH EXTRACTION      SOCIAL HISTORY: Social History   Socioeconomic History   Marital status: Single    Spouse name: Not on file   Number of children: Not on file   Years of education: Not on file  Highest education level: Not on file  Occupational History   Not on file  Tobacco Use   Smoking status: Former    Current packs/day: 0.25    Average packs/day: 0.3 packs/day for 3.0 years (0.8 ttl pk-yrs)    Types: Cigarettes    Passive exposure: Never   Smokeless tobacco: Never  Vaping Use   Vaping status: Never Used  Substance and Sexual Activity   Alcohol use: No   Drug use: No   Sexual activity: Yes    Birth control/protection: Implant  Other Topics Concern   Not on file  Social History Narrative   Not on file   Social Drivers of Health   Financial Resource Strain: Not on file  Food  Insecurity: No Food Insecurity (09/01/2019)   Hunger Vital Sign    Worried About Running Out of Food in the Last Year: Never true    Ran Out of Food in the Last Year: Never true  Transportation Needs: No Transportation Needs (09/01/2019)   PRAPARE - Administrator, Civil Service (Medical): No    Lack of Transportation (Non-Medical): No  Physical Activity: Not on file  Stress: Not on file  Social Connections: Not on file  Intimate Partner Violence: Not on file    FAMILY HISTORY: History reviewed. No pertinent family history.  ALLERGIES:  has no known allergies.  MEDICATIONS:  Current Outpatient Medications  Medication Sig Dispense Refill   acetaminophen (TYLENOL) 500 MG tablet Take 2 tablets (1,000 mg total) by mouth every 6 (six) hours as needed for mild pain (pain score 1-3).     CALCIUM-VITAMIN D PO Take 1 tablet by mouth daily.     cholecalciferol (VITAMIN D3) 25 MCG (1000 UNIT) tablet Take 1,000 Units by mouth daily.     Ferrous Sulfate 27 MG TABS Take 1 tablet (27 mg total) by mouth 2 (two) times daily. 180 tablet 3   ibuprofen (ADVIL) 600 MG tablet Take 1 tablet (600 mg total) by mouth every 8 (eight) hours as needed for moderate pain (pain score 4-6). 60 tablet 1   norethindrone (ORTHO MICRONOR) 0.35 MG tablet Take 1 tablet (0.35 mg total) by mouth daily. 84 tablet 3   ondansetron (ZOFRAN) 4 MG tablet Take 1 tablet (4 mg total) by mouth every 8 (eight) hours as needed for nausea or vomiting. 20 tablet 0   ondansetron (ZOFRAN) 4 MG tablet Take 1 tablet (4 mg total) by mouth every 8 (eight) hours as needed for vomiting or nausea. 30 tablet 0   No current facility-administered medications for this visit.    Review of Systems  Constitutional:  Positive for fatigue. Negative for appetite change, chills and fever.  HENT:   Negative for hearing loss and voice change.   Eyes:  Negative for eye problems.  Respiratory:  Negative for chest tightness and cough.    Cardiovascular:  Negative for chest pain.  Gastrointestinal:  Negative for abdominal distention, abdominal pain and blood in stool.  Endocrine: Negative for hot flashes.  Genitourinary:  Positive for menstrual problem. Negative for difficulty urinating and frequency.   Musculoskeletal:  Negative for arthralgias.  Skin:  Negative for itching and rash.  Neurological:  Negative for extremity weakness and light-headedness.  Hematological:  Negative for adenopathy.  Psychiatric/Behavioral:  Negative for confusion.     PHYSICAL EXAMINATION: Vitals:   05/20/23 1408  BP: 103/71  Pulse: 86  Resp: 18  Temp: (!) 96.7 F (35.9 C)  SpO2: 98%   Filed  Weights   05/20/23 1408  Weight: (!) 302 lb 6.4 oz (137.2 kg)    Physical Exam Constitutional:      General: She is not in acute distress. HENT:     Head: Normocephalic and atraumatic.  Eyes:     General: No scleral icterus. Cardiovascular:     Rate and Rhythm: Normal rate and regular rhythm.  Pulmonary:     Effort: Pulmonary effort is normal. No respiratory distress.     Breath sounds: No wheezing.  Abdominal:     General: Bowel sounds are normal. There is no distension.     Palpations: Abdomen is soft.  Musculoskeletal:        General: Normal range of motion.     Cervical back: Normal range of motion and neck supple.  Skin:    General: Skin is warm and dry.     Findings: No rash.  Neurological:     Mental Status: She is alert and oriented to person, place, and time. Mental status is at baseline.  Psychiatric:        Mood and Affect: Mood normal.      LABORATORY DATA:  I have reviewed the data as listed    Latest Ref Rng & Units 05/19/2023    3:58 PM 12/24/2022    8:31 AM 10/28/2022    2:59 PM  CBC  WBC 4.0 - 10.5 K/uL 5.5  4.3    Hemoglobin 12.0 - 15.0 g/dL 16.1  09.6  8.8   Hematocrit 36.0 - 46.0 % 33.5  37.1  30.5   Platelets 150 - 400 K/uL 253  243        Latest Ref Rng & Units 10/07/2022    4:07 PM 04/17/2016     1:53 PM  CMP  Glucose 70 - 99 mg/dL 74  90   BUN 6 - 20 mg/dL 14  14   Creatinine 0.45 - 1.00 mg/dL 4.09  8.11   Sodium 914 - 144 mmol/L 140  140   Potassium 3.5 - 5.2 mmol/L 4.1  3.7   Chloride 96 - 106 mmol/L 105  111   CO2 20 - 29 mmol/L 23  23   Calcium 8.7 - 10.2 mg/dL 9.0  8.9   Total Protein 6.0 - 8.5 g/dL 6.7  7.8   Total Bilirubin 0.0 - 1.2 mg/dL <7.8  0.4   Alkaline Phos 44 - 121 IU/L 86  93   AST 0 - 40 IU/L 12  17   ALT 0 - 32 IU/L 10  21    Lab Results  Component Value Date   IRON 33 05/19/2023   TIBC 379 05/19/2023   IRONPCTSAT 9 (L) 05/19/2023   FERRITIN 8 (L) 05/19/2023     RADIOGRAPHIC STUDIES: I have personally reviewed the radiological images as listed and agreed with the findings in the report. No results found.

## 2023-05-21 LAB — HGB FRACTIONATION CASCADE
Hgb A2: 2.1 % (ref 1.8–3.2)
Hgb A: 97.9 % (ref 96.4–98.8)
Hgb F: 0 % (ref 0.0–2.0)
Hgb S: 0 %

## 2023-05-27 ENCOUNTER — Inpatient Hospital Stay: Payer: Medicaid Other | Attending: Oncology

## 2023-05-27 VITALS — BP 116/89 | HR 92 | Temp 97.4°F | Resp 18

## 2023-05-27 DIAGNOSIS — D509 Iron deficiency anemia, unspecified: Secondary | ICD-10-CM | POA: Insufficient documentation

## 2023-05-27 DIAGNOSIS — N920 Excessive and frequent menstruation with regular cycle: Secondary | ICD-10-CM | POA: Diagnosis present

## 2023-05-27 MED ORDER — IRON SUCROSE 20 MG/ML IV SOLN
200.0000 mg | Freq: Once | INTRAVENOUS | Status: AC
  Administered 2023-05-27: 200 mg via INTRAVENOUS

## 2023-05-27 NOTE — Patient Instructions (Signed)

## 2023-06-03 ENCOUNTER — Inpatient Hospital Stay: Payer: Medicaid Other

## 2023-06-10 ENCOUNTER — Inpatient Hospital Stay: Payer: Medicaid Other

## 2023-08-19 ENCOUNTER — Inpatient Hospital Stay: Payer: Medicaid Other | Attending: Oncology

## 2023-08-26 ENCOUNTER — Inpatient Hospital Stay: Payer: Medicaid Other

## 2023-08-26 ENCOUNTER — Encounter: Payer: Self-pay | Admitting: Oncology

## 2023-08-26 ENCOUNTER — Other Ambulatory Visit

## 2023-08-26 ENCOUNTER — Inpatient Hospital Stay: Payer: Medicaid Other | Attending: Oncology | Admitting: Oncology

## 2024-05-12 ENCOUNTER — Telehealth: Payer: Self-pay | Admitting: Certified Nurse Midwife

## 2024-05-12 ENCOUNTER — Encounter: Payer: Self-pay | Admitting: Oncology

## 2024-05-12 ENCOUNTER — Other Ambulatory Visit: Payer: Self-pay | Admitting: Certified Nurse Midwife

## 2024-05-12 NOTE — Telephone Encounter (Signed)
 Patient reached out inquiring about [facility]. Patient states that she reached out to the office and they stated she would need a new referral since the referral placed back on 01/17/2023 has since expired. Patient last seen in office on 02/28/2023. Please advise.

## 2024-05-12 NOTE — Progress Notes (Signed)
 Referral to nutrition services renewed at patient request. Annual visit recommended with gyn and/or PCP.

## 2024-06-03 ENCOUNTER — Encounter: Payer: Self-pay | Admitting: Dietician

## 2024-06-09 ENCOUNTER — Encounter: Payer: Self-pay | Admitting: Dietician

## 2024-06-30 ENCOUNTER — Ambulatory Visit: Admitting: Certified Nurse Midwife
